# Patient Record
Sex: Female | Born: 1935 | Race: White | Hispanic: No | Smoking: Never smoker
Health system: Southern US, Community
[De-identification: ages and names within clinical notes are randomized; demographics above are authoritative.]

## PROBLEM LIST (undated history)

## (undated) DIAGNOSIS — H9190 Unspecified hearing loss, unspecified ear: Secondary | ICD-10-CM

## (undated) DIAGNOSIS — K648 Other hemorrhoids: Secondary | ICD-10-CM

## (undated) DIAGNOSIS — J984 Other disorders of lung: Secondary | ICD-10-CM

## (undated) DIAGNOSIS — N259 Disorder resulting from impaired renal tubular function, unspecified: Secondary | ICD-10-CM

## (undated) DIAGNOSIS — D696 Thrombocytopenia, unspecified: Secondary | ICD-10-CM

## (undated) DIAGNOSIS — M419 Scoliosis, unspecified: Secondary | ICD-10-CM

## (undated) DIAGNOSIS — E785 Hyperlipidemia, unspecified: Secondary | ICD-10-CM

## (undated) DIAGNOSIS — K5730 Diverticulosis of large intestine without perforation or abscess without bleeding: Secondary | ICD-10-CM

## (undated) DIAGNOSIS — K219 Gastro-esophageal reflux disease without esophagitis: Secondary | ICD-10-CM

## (undated) DIAGNOSIS — R42 Dizziness and giddiness: Secondary | ICD-10-CM

## (undated) DIAGNOSIS — Z853 Personal history of malignant neoplasm of breast: Secondary | ICD-10-CM

## (undated) DIAGNOSIS — I6529 Occlusion and stenosis of unspecified carotid artery: Secondary | ICD-10-CM

## (undated) DIAGNOSIS — K222 Esophageal obstruction: Secondary | ICD-10-CM

## (undated) DIAGNOSIS — I10 Essential (primary) hypertension: Secondary | ICD-10-CM

## (undated) DIAGNOSIS — E559 Vitamin D deficiency, unspecified: Secondary | ICD-10-CM

## (undated) DIAGNOSIS — M810 Age-related osteoporosis without current pathological fracture: Secondary | ICD-10-CM

## (undated) HISTORY — DX: Scoliosis, unspecified: M41.9

## (undated) HISTORY — DX: Other hemorrhoids: K64.8

## (undated) HISTORY — DX: Esophageal obstruction: K22.2

## (undated) HISTORY — DX: Age-related osteoporosis without current pathological fracture: M81.0

## (undated) HISTORY — DX: Diverticulosis of large intestine without perforation or abscess without bleeding: K57.30

## (undated) HISTORY — PX: TONSILLECTOMY: SUR1361

## (undated) HISTORY — DX: Disorder resulting from impaired renal tubular function, unspecified: N25.9

## (undated) HISTORY — PX: CARPAL TUNNEL RELEASE: SHX101

## (undated) HISTORY — DX: Personal history of malignant neoplasm of breast: Z85.3

## (undated) HISTORY — DX: Essential (primary) hypertension: I10

## (undated) HISTORY — DX: Hyperlipidemia, unspecified: E78.5

## (undated) HISTORY — DX: Dizziness and giddiness: R42

## (undated) HISTORY — DX: Thrombocytopenia, unspecified: D69.6

## (undated) HISTORY — DX: Other disorders of lung: J98.4

## (undated) HISTORY — DX: Vitamin D deficiency, unspecified: E55.9

## (undated) HISTORY — DX: Gastro-esophageal reflux disease without esophagitis: K21.9

## (undated) HISTORY — PX: HEMORRHOID BANDING: SHX5850

## (undated) HISTORY — DX: Occlusion and stenosis of unspecified carotid artery: I65.29

## (undated) HISTORY — DX: Unspecified hearing loss, unspecified ear: H91.90

---

## 1987-07-25 HISTORY — PX: TUBAL LIGATION: SHX77

## 1998-05-03 DIAGNOSIS — K219 Gastro-esophageal reflux disease without esophagitis: Secondary | ICD-10-CM

## 1998-05-03 DIAGNOSIS — K222 Esophageal obstruction: Secondary | ICD-10-CM | POA: Insufficient documentation

## 1998-05-03 HISTORY — DX: Gastro-esophageal reflux disease without esophagitis: K21.9

## 1998-05-03 HISTORY — DX: Esophageal obstruction: K22.2

## 1998-06-01 ENCOUNTER — Other Ambulatory Visit: Admission: RE | Admit: 1998-06-01 | Discharge: 1998-06-01 | Payer: Self-pay | Admitting: *Deleted

## 1998-06-30 ENCOUNTER — Encounter: Payer: Self-pay | Admitting: Gastroenterology

## 1998-07-08 ENCOUNTER — Ambulatory Visit (HOSPITAL_BASED_OUTPATIENT_CLINIC_OR_DEPARTMENT_OTHER): Admission: RE | Admit: 1998-07-08 | Discharge: 1998-07-08 | Payer: Self-pay | Admitting: *Deleted

## 2003-07-13 ENCOUNTER — Other Ambulatory Visit: Admission: RE | Admit: 2003-07-13 | Discharge: 2003-07-13 | Payer: Self-pay | Admitting: Endocrinology

## 2003-09-09 DIAGNOSIS — K5730 Diverticulosis of large intestine without perforation or abscess without bleeding: Secondary | ICD-10-CM | POA: Insufficient documentation

## 2003-09-09 DIAGNOSIS — K648 Other hemorrhoids: Secondary | ICD-10-CM

## 2003-09-09 HISTORY — DX: Other hemorrhoids: K64.8

## 2003-09-09 HISTORY — DX: Diverticulosis of large intestine without perforation or abscess without bleeding: K57.30

## 2003-09-22 HISTORY — PX: MASTECTOMY: SHX3

## 2003-09-25 ENCOUNTER — Ambulatory Visit (HOSPITAL_COMMUNITY): Admission: RE | Admit: 2003-09-25 | Discharge: 2003-09-25 | Payer: Self-pay | Admitting: *Deleted

## 2003-09-28 ENCOUNTER — Encounter (INDEPENDENT_AMBULATORY_CARE_PROVIDER_SITE_OTHER): Payer: Self-pay | Admitting: *Deleted

## 2003-09-28 ENCOUNTER — Ambulatory Visit (HOSPITAL_COMMUNITY): Admission: RE | Admit: 2003-09-28 | Discharge: 2003-09-29 | Payer: Self-pay | Admitting: *Deleted

## 2003-10-12 ENCOUNTER — Ambulatory Visit (HOSPITAL_COMMUNITY): Admission: RE | Admit: 2003-10-12 | Discharge: 2003-10-12 | Payer: Self-pay | Admitting: *Deleted

## 2003-11-09 ENCOUNTER — Encounter: Admission: RE | Admit: 2003-11-09 | Discharge: 2003-11-09 | Payer: Self-pay | Admitting: Oncology

## 2003-12-02 ENCOUNTER — Ambulatory Visit (HOSPITAL_COMMUNITY): Admission: RE | Admit: 2003-12-02 | Discharge: 2003-12-02 | Payer: Self-pay | Admitting: Oncology

## 2004-04-28 ENCOUNTER — Other Ambulatory Visit: Admission: RE | Admit: 2004-04-28 | Discharge: 2004-04-28 | Payer: Self-pay | Admitting: Endocrinology

## 2004-05-03 HISTORY — PX: US ECHOCARDIOGRAPHY: HXRAD669

## 2004-06-03 ENCOUNTER — Ambulatory Visit: Payer: Self-pay | Admitting: Oncology

## 2004-11-24 ENCOUNTER — Ambulatory Visit: Payer: Self-pay | Admitting: Oncology

## 2005-02-24 ENCOUNTER — Ambulatory Visit: Payer: Self-pay | Admitting: Oncology

## 2005-03-28 ENCOUNTER — Ambulatory Visit: Payer: Self-pay | Admitting: Endocrinology

## 2005-04-25 ENCOUNTER — Ambulatory Visit: Payer: Self-pay | Admitting: Endocrinology

## 2005-05-02 ENCOUNTER — Ambulatory Visit: Payer: Self-pay | Admitting: Endocrinology

## 2005-05-02 HISTORY — PX: ELECTROCARDIOGRAM: SHX264

## 2005-05-19 ENCOUNTER — Ambulatory Visit: Payer: Self-pay | Admitting: Oncology

## 2005-10-23 ENCOUNTER — Ambulatory Visit: Payer: Self-pay | Admitting: Family Medicine

## 2005-11-23 ENCOUNTER — Ambulatory Visit: Payer: Self-pay | Admitting: Oncology

## 2005-11-27 LAB — LACTATE DEHYDROGENASE: LDH: 208 U/L (ref 94–250)

## 2005-11-27 LAB — COMPREHENSIVE METABOLIC PANEL
AST: 25 U/L (ref 0–37)
BUN: 29 mg/dL — ABNORMAL HIGH (ref 6–23)
CO2: 28 mEq/L (ref 19–32)
Calcium: 9 mg/dL (ref 8.4–10.5)
Chloride: 103 mEq/L (ref 96–112)
Creatinine, Ser: 1.3 mg/dL — ABNORMAL HIGH (ref 0.4–1.2)
Glucose, Bld: 105 mg/dL — ABNORMAL HIGH (ref 70–99)

## 2005-11-27 LAB — CANCER ANTIGEN 27.29: CA 27.29: 19 U/mL (ref 0–39)

## 2005-11-27 LAB — CBC WITH DIFFERENTIAL/PLATELET
Basophils Absolute: 0 10*3/uL (ref 0.0–0.1)
EOS%: 5.9 % (ref 0.0–7.0)
Eosinophils Absolute: 0.5 10*3/uL (ref 0.0–0.5)
HCT: 33.9 % — ABNORMAL LOW (ref 34.8–46.6)
HGB: 11.6 g/dL (ref 11.6–15.9)
MCH: 29.6 pg (ref 26.0–34.0)
NEUT%: 74.3 % (ref 39.6–76.8)
lymph#: 1.2 10*3/uL (ref 0.9–3.3)

## 2005-11-30 ENCOUNTER — Ambulatory Visit: Payer: Self-pay | Admitting: Endocrinology

## 2006-05-03 ENCOUNTER — Ambulatory Visit: Payer: Self-pay | Admitting: Endocrinology

## 2006-05-03 LAB — CONVERTED CEMR LAB
ALT: 22 units/L (ref 0–40)
Basophils Relative: 0.4 % (ref 0.0–1.0)
Bilirubin Urine: NEGATIVE
CO2: 31 meq/L (ref 19–32)
Calcium: 9.6 mg/dL (ref 8.4–10.5)
Chloride: 106 meq/L (ref 96–112)
Chol/HDL Ratio, serum: 3.2
Glomerular Filtration Rate, Af Am: 52 mL/min/{1.73_m2}
Glucose, Bld: 94 mg/dL (ref 70–99)
HDL: 44.6 mg/dL (ref >39.0)
Hemoglobin, Urine: NEGATIVE
Ketones, ur: NEGATIVE mg/dL
LDL Cholesterol: 84 mg/dL (ref 0–99)
Lymphocytes Relative: 18.4 % (ref 12.0–46.0)
MCV: 87.8 fL (ref 78.0–100.0)
Monocytes Absolute: 0.3 10*3/uL (ref 0.2–0.7)
Platelets: 230 10*3/uL (ref 150–400)
TSH: 1.68 microintl units/mL (ref 0.35–5.50)
Triglyceride fasting, serum: 79 mg/dL (ref 0–149)
VLDL: 16 mg/dL (ref 0–40)
WBC: 7.6 10*3/uL (ref 4.5–10.5)
pH: 6 (ref 5.0–8.0)

## 2006-05-30 ENCOUNTER — Ambulatory Visit: Payer: Self-pay | Admitting: Oncology

## 2006-06-01 LAB — CBC WITH DIFFERENTIAL/PLATELET
Basophils Absolute: 0 10*3/uL (ref 0.0–0.1)
EOS%: 6.6 % (ref 0.0–7.0)
HCT: 32 % — ABNORMAL LOW (ref 34.8–46.6)
HGB: 10.9 g/dL — ABNORMAL LOW (ref 11.6–15.9)
LYMPH%: 15.8 % (ref 14.0–48.0)
MCH: 29.9 pg (ref 26.0–34.0)
MCV: 87.6 fL (ref 81.0–101.0)
MONO%: 6 % (ref 0.0–13.0)
NEUT%: 71.3 % (ref 39.6–76.8)

## 2006-06-01 LAB — COMPREHENSIVE METABOLIC PANEL
AST: 22 U/L (ref 0–37)
Alkaline Phosphatase: 52 U/L (ref 39–117)
BUN: 36 mg/dL — ABNORMAL HIGH (ref 6–23)
Calcium: 9.4 mg/dL (ref 8.4–10.5)
Creatinine, Ser: 1.36 mg/dL — ABNORMAL HIGH (ref 0.40–1.20)
Total Bilirubin: 0.3 mg/dL (ref 0.3–1.2)

## 2006-06-22 LAB — CBC & DIFF AND RETIC
Eosinophils Absolute: 0.8 10*3/uL — ABNORMAL HIGH (ref 0.0–0.5)
HCT: 33.7 % — ABNORMAL LOW (ref 34.8–46.6)
LYMPH%: 17.7 % (ref 14.0–48.0)
MONO#: 0.5 10*3/uL (ref 0.1–0.9)
NEUT#: 6 10*3/uL (ref 1.5–6.5)
Platelets: 324 10*3/uL (ref 145–400)
RBC: 3.86 10*6/uL (ref 3.70–5.32)
RETIC #: 51.3 10*3/uL (ref 19.7–115.1)
Retic %: 1.3 % (ref 0.4–2.3)
WBC: 9 10*3/uL (ref 3.9–10.0)

## 2006-06-22 LAB — MORPHOLOGY: PLT EST: ADEQUATE

## 2006-06-26 LAB — FERRITIN: Ferritin: 27 ng/mL (ref 10–291)

## 2006-06-26 LAB — PROTEIN ELECTROPHORESIS, SERUM
Alpha-1-Globulin: 4.4 % (ref 2.9–4.9)
Alpha-2-Globulin: 9.8 % (ref 7.1–11.8)
Beta Globulin: 7.1 % (ref 4.7–7.2)
Total Protein, Serum Electrophoresis: 7.3 g/dL (ref 6.0–8.3)

## 2006-06-26 LAB — FOLATE RBC: RBC Folate: 1022 ng/mL — ABNORMAL HIGH (ref 180–600)

## 2006-06-26 LAB — IRON AND TIBC
%SAT: 24 % (ref 20–55)
Iron: 82 ug/dL (ref 42–145)

## 2006-09-21 ENCOUNTER — Ambulatory Visit: Payer: Self-pay | Admitting: Endocrinology

## 2006-11-28 ENCOUNTER — Ambulatory Visit: Payer: Self-pay | Admitting: Oncology

## 2006-11-30 LAB — COMPREHENSIVE METABOLIC PANEL
ALT: 16 U/L (ref 0–35)
AST: 24 U/L (ref 0–37)
Albumin: 3.9 g/dL (ref 3.5–5.2)
CO2: 27 mEq/L (ref 19–32)
Calcium: 9.5 mg/dL (ref 8.4–10.5)
Chloride: 102 mEq/L (ref 96–112)
Potassium: 4.4 mEq/L (ref 3.5–5.3)
Total Protein: 6.8 g/dL (ref 6.0–8.3)

## 2006-11-30 LAB — CBC WITH DIFFERENTIAL/PLATELET
BASO%: 0.5 % (ref 0.0–2.0)
EOS%: 6 % (ref 0.0–7.0)
HCT: 32.3 % — ABNORMAL LOW (ref 34.8–46.6)
HGB: 11.4 g/dL — ABNORMAL LOW (ref 11.6–15.9)
MCH: 29.8 pg (ref 26.0–34.0)
MCHC: 35.3 g/dL (ref 32.0–36.0)
MONO#: 0.6 10*3/uL (ref 0.1–0.9)
NEUT%: 70.8 % (ref 39.6–76.8)
RDW: 13.6 % (ref 11.3–14.5)
WBC: 9.4 10*3/uL (ref 3.9–10.0)
lymph#: 1.6 10*3/uL (ref 0.9–3.3)

## 2007-02-20 ENCOUNTER — Ambulatory Visit: Payer: Self-pay | Admitting: Internal Medicine

## 2007-02-20 LAB — CONVERTED CEMR LAB
Basophils Relative: 0 % (ref 0.0–1.0)
Eosinophils Relative: 6.7 % — ABNORMAL HIGH (ref 0.0–5.0)
HCT: 35.4 % — ABNORMAL LOW (ref 36.0–46.0)
Hemoglobin: 12.2 g/dL (ref 12.0–15.0)
Lymphocytes Relative: 20.8 % (ref 12.0–46.0)
Monocytes Absolute: 0.8 10*3/uL — ABNORMAL HIGH (ref 0.2–0.7)
Monocytes Relative: 6.6 % (ref 3.0–11.0)
Neutro Abs: 7.6 10*3/uL (ref 1.4–7.7)
Neutrophils Relative %: 65.9 % (ref 43.0–77.0)
Prothrombin Time: 11.8 s (ref 10.0–14.0)
WBC: 11.6 10*3/uL — ABNORMAL HIGH (ref 4.5–10.5)

## 2007-02-27 ENCOUNTER — Ambulatory Visit: Payer: Self-pay | Admitting: Internal Medicine

## 2007-02-27 LAB — CONVERTED CEMR LAB
Fecal Occult Blood: NEGATIVE
OCCULT 5: NEGATIVE

## 2007-02-28 DIAGNOSIS — E785 Hyperlipidemia, unspecified: Secondary | ICD-10-CM | POA: Insufficient documentation

## 2007-02-28 DIAGNOSIS — R42 Dizziness and giddiness: Secondary | ICD-10-CM

## 2007-02-28 DIAGNOSIS — Z853 Personal history of malignant neoplasm of breast: Secondary | ICD-10-CM | POA: Insufficient documentation

## 2007-02-28 DIAGNOSIS — I10 Essential (primary) hypertension: Secondary | ICD-10-CM

## 2007-02-28 HISTORY — DX: Personal history of malignant neoplasm of breast: Z85.3

## 2007-02-28 HISTORY — DX: Essential (primary) hypertension: I10

## 2007-02-28 HISTORY — DX: Hyperlipidemia, unspecified: E78.5

## 2007-02-28 HISTORY — DX: Dizziness and giddiness: R42

## 2007-03-19 ENCOUNTER — Ambulatory Visit: Payer: Self-pay | Admitting: Gastroenterology

## 2007-03-21 ENCOUNTER — Ambulatory Visit: Payer: Self-pay | Admitting: Gastroenterology

## 2007-04-05 ENCOUNTER — Ambulatory Visit: Payer: Self-pay | Admitting: Oncology

## 2007-05-30 ENCOUNTER — Ambulatory Visit: Payer: Self-pay | Admitting: Oncology

## 2007-06-03 LAB — FERRITIN: Ferritin: 44 ng/mL (ref 10–291)

## 2007-06-03 LAB — CBC & DIFF AND RETIC
BASO%: 0.4 % (ref 0.0–2.0)
EOS%: 6 % (ref 0.0–7.0)
IRF: 0.23 (ref 0.130–0.330)
MCH: 30.2 pg (ref 26.0–34.0)
MCHC: 35 g/dL (ref 32.0–36.0)
MONO#: 0.5 10*3/uL (ref 0.1–0.9)
NEUT%: 73.1 % (ref 39.6–76.8)
RBC: 4.25 10*6/uL (ref 3.70–5.32)
RDW: 13 % (ref 11.3–14.5)
RETIC #: 41.7 10*3/uL (ref 19.7–115.1)
Retic %: 1 % (ref 0.4–2.3)
WBC: 9.8 10*3/uL (ref 3.9–10.0)
lymph#: 1.5 10*3/uL (ref 0.9–3.3)

## 2007-06-03 LAB — COMPREHENSIVE METABOLIC PANEL
ALT: 16 U/L (ref 0–35)
CO2: 25 mEq/L (ref 19–32)
Calcium: 9.7 mg/dL (ref 8.4–10.5)
Chloride: 101 mEq/L (ref 96–112)
Creatinine, Ser: 1.26 mg/dL — ABNORMAL HIGH (ref 0.40–1.20)
Glucose, Bld: 84 mg/dL (ref 70–99)

## 2007-06-03 LAB — IRON AND TIBC
%SAT: 25 % (ref 20–55)
Iron: 73 ug/dL (ref 42–145)

## 2007-06-03 LAB — CHCC SMEAR

## 2007-06-10 ENCOUNTER — Encounter: Payer: Self-pay | Admitting: Endocrinology

## 2007-07-04 ENCOUNTER — Encounter: Payer: Self-pay | Admitting: Endocrinology

## 2007-08-01 ENCOUNTER — Telehealth: Payer: Self-pay | Admitting: Endocrinology

## 2007-08-06 ENCOUNTER — Telehealth: Payer: Self-pay | Admitting: Endocrinology

## 2007-09-04 ENCOUNTER — Ambulatory Visit: Payer: Self-pay | Admitting: Endocrinology

## 2007-09-04 LAB — CONVERTED CEMR LAB
Alkaline Phosphatase: 55 units/L (ref 39–117)
BUN: 21 mg/dL (ref 6–23)
Basophils Relative: 0.8 % (ref 0.0–1.0)
Bilirubin Urine: NEGATIVE
Bilirubin, Direct: 0.2 mg/dL (ref 0.0–0.3)
CO2: 32 meq/L (ref 19–32)
Creatinine, Ser: 1.1 mg/dL (ref 0.4–1.2)
GFR calc Af Amer: 62 mL/min
HCT: 38.5 % (ref 36.0–46.0)
HDL: 54.9 mg/dL (ref >39.0)
Hemoglobin, Urine: NEGATIVE
Hemoglobin: 12.4 g/dL (ref 12.0–15.0)
Leukocytes, UA: NEGATIVE
Lymphocytes Relative: 17.5 % (ref 12.0–46.0)
MCHC: 32.3 g/dL (ref 30.0–36.0)
Monocytes Absolute: 0.4 10*3/uL (ref 0.2–0.7)
Monocytes Relative: 5.8 % (ref 3.0–11.0)
Neutro Abs: 4.6 10*3/uL (ref 1.4–7.7)
Neutrophils Relative %: 68 % (ref 43.0–77.0)
Potassium: 4.3 meq/L (ref 3.5–5.1)
Sodium: 140 meq/L (ref 135–145)
Specific Gravity, Urine: 1.015 (ref 1.000–1.03)
TSH: 1.94 microintl units/mL (ref 0.35–5.50)
Total Bilirubin: 0.8 mg/dL (ref 0.3–1.2)
Total Protein: 6.9 g/dL (ref 6.0–8.3)
Triglycerides: 86 mg/dL (ref 0–149)
Urobilinogen, UA: 0.2 (ref 0.0–1.0)

## 2007-09-10 ENCOUNTER — Ambulatory Visit: Payer: Self-pay | Admitting: Endocrinology

## 2007-09-16 ENCOUNTER — Encounter: Payer: Self-pay | Admitting: Endocrinology

## 2007-10-17 ENCOUNTER — Encounter: Payer: Self-pay | Admitting: Endocrinology

## 2007-11-28 ENCOUNTER — Ambulatory Visit: Payer: Self-pay | Admitting: Internal Medicine

## 2007-11-28 ENCOUNTER — Encounter: Payer: Self-pay | Admitting: Endocrinology

## 2007-12-03 ENCOUNTER — Ambulatory Visit: Payer: Self-pay | Admitting: Oncology

## 2007-12-12 ENCOUNTER — Encounter: Payer: Self-pay | Admitting: Endocrinology

## 2008-02-03 ENCOUNTER — Encounter: Payer: Self-pay | Admitting: Endocrinology

## 2008-03-27 ENCOUNTER — Encounter: Payer: Self-pay | Admitting: Endocrinology

## 2008-06-12 ENCOUNTER — Ambulatory Visit: Payer: Self-pay | Admitting: Oncology

## 2008-06-16 ENCOUNTER — Encounter (INDEPENDENT_AMBULATORY_CARE_PROVIDER_SITE_OTHER): Payer: Self-pay | Admitting: *Deleted

## 2008-06-16 LAB — CONVERTED CEMR LAB
ALT: 16 U/L
AST: 23 U/L
Albumin: 3.8 g/dL
Alkaline Phosphatase: 67 U/L
BUN: 29 mg/dL
CO2: 26 meq/L
Calcium: 9.3 mg/dL
Chloride: 102 meq/L
Creatinine, Ser: 1.22 mg/dL
Glucose, Bld: 102 mg/dL
Potassium: 4.5 meq/L
Sodium: 137 meq/L
Total Bilirubin: 0.4 mg/dL
Total Protein: 6.6 g/dL

## 2008-06-16 LAB — CBC WITH DIFFERENTIAL/PLATELET
Basophils Absolute: 0 10*3/uL (ref 0.0–0.1)
Eosinophils Absolute: 0.7 10*3/uL — ABNORMAL HIGH (ref 0.0–0.5)
HCT: 34.5 % — ABNORMAL LOW (ref 34.8–46.6)
HGB: 11.9 g/dL (ref 11.6–15.9)
LYMPH%: 18 % (ref 14.0–48.0)
MCV: 87 fL (ref 81.0–101.0)
MONO#: 0.6 10*3/uL (ref 0.1–0.9)
NEUT#: 6 10*3/uL (ref 1.5–6.5)
NEUT%: 67.3 % (ref 39.6–76.8)
Platelets: 312 10*3/uL (ref 145–400)
RBC: 3.97 10*6/uL (ref 3.70–5.32)
WBC: 9 10*3/uL (ref 3.9–10.0)

## 2008-06-17 LAB — VITAMIN D 25 HYDROXY (VIT D DEFICIENCY, FRACTURES): Vit D, 25-Hydroxy: 37 ng/mL (ref 30–89)

## 2008-06-17 LAB — COMPREHENSIVE METABOLIC PANEL
BUN: 29 mg/dL — ABNORMAL HIGH (ref 6–23)
CO2: 26 mEq/L (ref 19–32)
Glucose, Bld: 102 mg/dL — ABNORMAL HIGH (ref 70–99)
Sodium: 137 mEq/L (ref 135–145)
Total Bilirubin: 0.4 mg/dL (ref 0.3–1.2)
Total Protein: 6.6 g/dL (ref 6.0–8.3)

## 2008-06-17 LAB — LACTATE DEHYDROGENASE: LDH: 186 U/L (ref 94–250)

## 2008-06-17 LAB — CANCER ANTIGEN 27.29: CA 27.29: 16 U/mL (ref 0–39)

## 2008-06-22 ENCOUNTER — Encounter: Payer: Self-pay | Admitting: Endocrinology

## 2008-07-14 ENCOUNTER — Encounter: Payer: Self-pay | Admitting: Endocrinology

## 2008-09-16 ENCOUNTER — Encounter: Payer: Self-pay | Admitting: Internal Medicine

## 2008-11-23 ENCOUNTER — Encounter: Payer: Self-pay | Admitting: Endocrinology

## 2008-11-23 ENCOUNTER — Encounter: Payer: Self-pay | Admitting: Internal Medicine

## 2008-12-14 ENCOUNTER — Telehealth (INDEPENDENT_AMBULATORY_CARE_PROVIDER_SITE_OTHER): Payer: Self-pay | Admitting: *Deleted

## 2008-12-14 ENCOUNTER — Ambulatory Visit: Payer: Self-pay | Admitting: Oncology

## 2008-12-16 ENCOUNTER — Encounter (INDEPENDENT_AMBULATORY_CARE_PROVIDER_SITE_OTHER): Payer: Self-pay | Admitting: *Deleted

## 2008-12-16 LAB — CONVERTED CEMR LAB
ALT: 22 units/L
AST: 28 units/L
Alkaline Phosphatase: 59 units/L
CO2: 27 meq/L
Calcium: 9.2 mg/dL
Glucose, Bld: 130 mg/dL
Sodium: 142 meq/L

## 2008-12-16 LAB — CBC WITH DIFFERENTIAL/PLATELET
Eosinophils Absolute: 0.5 10*3/uL (ref 0.0–0.5)
HCT: 34.1 % — ABNORMAL LOW (ref 34.8–46.6)
LYMPH%: 18.7 % (ref 14.0–49.7)
MONO#: 0.4 10*3/uL (ref 0.1–0.9)
NEUT#: 5.4 10*3/uL (ref 1.5–6.5)
NEUT%: 69.9 % (ref 38.4–76.8)
Platelets: 280 10*3/uL (ref 145–400)
RBC: 3.89 10*6/uL (ref 3.70–5.45)
WBC: 7.7 10*3/uL (ref 3.9–10.3)

## 2008-12-17 LAB — COMPREHENSIVE METABOLIC PANEL
CO2: 27 mEq/L (ref 19–32)
Calcium: 9.2 mg/dL (ref 8.4–10.5)
Chloride: 105 mEq/L (ref 96–112)
Glucose, Bld: 130 mg/dL — ABNORMAL HIGH (ref 70–99)
Sodium: 142 mEq/L (ref 135–145)
Total Bilirubin: 0.4 mg/dL (ref 0.3–1.2)
Total Protein: 6.4 g/dL (ref 6.0–8.3)

## 2008-12-17 LAB — CANCER ANTIGEN 27.29: CA 27.29: 20 U/mL (ref 0–39)

## 2008-12-17 LAB — LACTATE DEHYDROGENASE: LDH: 198 U/L (ref 94–250)

## 2008-12-25 ENCOUNTER — Ambulatory Visit: Payer: Self-pay | Admitting: Endocrinology

## 2008-12-25 DIAGNOSIS — R05 Cough: Secondary | ICD-10-CM | POA: Insufficient documentation

## 2008-12-25 DIAGNOSIS — R059 Cough, unspecified: Secondary | ICD-10-CM | POA: Insufficient documentation

## 2008-12-25 DIAGNOSIS — N259 Disorder resulting from impaired renal tubular function, unspecified: Secondary | ICD-10-CM | POA: Insufficient documentation

## 2008-12-25 DIAGNOSIS — D696 Thrombocytopenia, unspecified: Secondary | ICD-10-CM | POA: Insufficient documentation

## 2008-12-25 HISTORY — DX: Disorder resulting from impaired renal tubular function, unspecified: N25.9

## 2008-12-25 HISTORY — DX: Thrombocytopenia, unspecified: D69.6

## 2008-12-30 ENCOUNTER — Encounter: Payer: Self-pay | Admitting: Endocrinology

## 2009-04-15 ENCOUNTER — Ambulatory Visit: Payer: Self-pay | Admitting: Endocrinology

## 2009-04-15 DIAGNOSIS — M25559 Pain in unspecified hip: Secondary | ICD-10-CM | POA: Insufficient documentation

## 2009-04-19 ENCOUNTER — Telehealth: Payer: Self-pay | Admitting: Endocrinology

## 2009-04-19 ENCOUNTER — Ambulatory Visit: Payer: Self-pay | Admitting: Endocrinology

## 2009-04-19 LAB — CONVERTED CEMR LAB
BUN: 25 mg/dL — ABNORMAL HIGH (ref 6–23)
Creatinine, Ser: 1.2 mg/dL (ref 0.4–1.2)

## 2009-04-28 ENCOUNTER — Telehealth: Payer: Self-pay | Admitting: Endocrinology

## 2009-05-11 ENCOUNTER — Encounter: Payer: Self-pay | Admitting: Endocrinology

## 2009-05-20 ENCOUNTER — Encounter: Payer: Self-pay | Admitting: Endocrinology

## 2009-09-30 ENCOUNTER — Encounter: Payer: Self-pay | Admitting: Endocrinology

## 2009-10-04 ENCOUNTER — Telehealth (INDEPENDENT_AMBULATORY_CARE_PROVIDER_SITE_OTHER): Payer: Self-pay | Admitting: *Deleted

## 2009-10-29 ENCOUNTER — Encounter (INDEPENDENT_AMBULATORY_CARE_PROVIDER_SITE_OTHER): Payer: Self-pay | Admitting: *Deleted

## 2009-10-29 ENCOUNTER — Encounter: Payer: Self-pay | Admitting: Endocrinology

## 2009-10-29 LAB — CONVERTED CEMR LAB
CO2: 31 meq/L
Calcium: 9.5 mg/dL
Chloride: 101 meq/L
Creatinine, Ser: 1.07 mg/dL

## 2009-11-15 ENCOUNTER — Encounter: Payer: Self-pay | Admitting: Endocrinology

## 2009-11-29 ENCOUNTER — Encounter: Payer: Self-pay | Admitting: Endocrinology

## 2009-11-29 ENCOUNTER — Ambulatory Visit: Payer: Self-pay | Admitting: Family Medicine

## 2009-11-29 ENCOUNTER — Encounter (INDEPENDENT_AMBULATORY_CARE_PROVIDER_SITE_OTHER): Payer: Self-pay | Admitting: *Deleted

## 2009-12-22 ENCOUNTER — Ambulatory Visit: Payer: Self-pay | Admitting: Oncology

## 2009-12-23 ENCOUNTER — Encounter (INDEPENDENT_AMBULATORY_CARE_PROVIDER_SITE_OTHER): Payer: Self-pay | Admitting: *Deleted

## 2009-12-23 ENCOUNTER — Encounter: Payer: Self-pay | Admitting: Endocrinology

## 2009-12-23 LAB — CONVERTED CEMR LAB
ALT: 24 units/L
AST: 30 units/L
Alkaline Phosphatase: 53 units/L
BUN: 31 mg/dL
Chloride: 103 meq/L
Creatinine, Ser: 1.18 mg/dL
Glucose, Bld: 93 mg/dL
Total Bilirubin: 6.7 mg/dL
Total Protein: 6.7 g/dL

## 2009-12-23 LAB — CBC WITH DIFFERENTIAL/PLATELET
BASO%: 0.4 % (ref 0.0–2.0)
Eosinophils Absolute: 0.4 10*3/uL (ref 0.0–0.5)
MCHC: 34.3 g/dL (ref 31.5–36.0)
MCV: 88.7 fL (ref 79.5–101.0)
MONO%: 5.9 % (ref 0.0–14.0)
NEUT#: 5.8 10*3/uL (ref 1.5–6.5)
RBC: 3.98 10*6/uL (ref 3.70–5.45)
RDW: 13.2 % (ref 11.2–14.5)
WBC: 8.4 10*3/uL (ref 3.9–10.3)

## 2009-12-23 LAB — COMPREHENSIVE METABOLIC PANEL
ALT: 24 U/L (ref 0–35)
AST: 30 U/L (ref 0–37)
Albumin: 4 g/dL (ref 3.5–5.2)
Alkaline Phosphatase: 53 U/L (ref 39–117)
Glucose, Bld: 93 mg/dL (ref 70–99)
Potassium: 4.4 mEq/L (ref 3.5–5.3)
Sodium: 138 mEq/L (ref 135–145)
Total Protein: 6.7 g/dL (ref 6.0–8.3)

## 2009-12-23 LAB — CANCER ANTIGEN 27.29: CA 27.29: 25 U/mL (ref 0–39)

## 2009-12-24 ENCOUNTER — Telehealth (INDEPENDENT_AMBULATORY_CARE_PROVIDER_SITE_OTHER): Payer: Self-pay | Admitting: *Deleted

## 2009-12-24 ENCOUNTER — Encounter (INDEPENDENT_AMBULATORY_CARE_PROVIDER_SITE_OTHER): Payer: Self-pay | Admitting: *Deleted

## 2009-12-27 ENCOUNTER — Encounter: Payer: Self-pay | Admitting: Endocrinology

## 2010-01-10 ENCOUNTER — Ambulatory Visit: Payer: Self-pay | Admitting: Internal Medicine

## 2010-04-14 ENCOUNTER — Encounter: Payer: Self-pay | Admitting: Endocrinology

## 2010-05-03 ENCOUNTER — Encounter: Payer: Self-pay | Admitting: Endocrinology

## 2010-05-12 ENCOUNTER — Encounter: Payer: Self-pay | Admitting: Endocrinology

## 2010-06-13 ENCOUNTER — Encounter: Payer: Self-pay | Admitting: Endocrinology

## 2010-06-13 ENCOUNTER — Ambulatory Visit: Payer: Self-pay | Admitting: Endocrinology

## 2010-06-13 DIAGNOSIS — I6529 Occlusion and stenosis of unspecified carotid artery: Secondary | ICD-10-CM

## 2010-06-13 DIAGNOSIS — M810 Age-related osteoporosis without current pathological fracture: Secondary | ICD-10-CM

## 2010-06-13 HISTORY — DX: Occlusion and stenosis of unspecified carotid artery: I65.29

## 2010-06-13 HISTORY — DX: Age-related osteoporosis without current pathological fracture: M81.0

## 2010-06-13 LAB — CONVERTED CEMR LAB
ALT: 23 units/L (ref 0–35)
Albumin: 3.6 g/dL (ref 3.5–5.2)
Alkaline Phosphatase: 51 units/L (ref 39–117)
Bilirubin, Direct: 0.1 mg/dL (ref 0.0–0.3)
Cholesterol: 139 mg/dL (ref 0–200)
TSH: 1.7 microintl units/mL (ref 0.35–5.50)
Total Protein: 6.3 g/dL (ref 6.0–8.3)
Triglycerides: 60 mg/dL (ref 0.0–149.0)

## 2010-08-14 ENCOUNTER — Encounter: Payer: Self-pay | Admitting: Endocrinology

## 2010-08-14 ENCOUNTER — Encounter: Payer: Self-pay | Admitting: Oncology

## 2010-08-23 NOTE — Assessment & Plan Note (Signed)
Summary: SORE THROAT/NWS   Vital Signs:  Patient profile:   77 year old female Height:      65 inches Weight:      151 pounds BMI:     25.22 O2 Sat:      94 % on Room air Temp:     97.4 degrees F oral Pulse rate:   54 / minute BP sitting:   102 / 62  (left arm) Cuff size:   regular  Vitals Entered ByZella Ball Ewing (January 10, 2010 10:50 AM)  O2 Flow:  Room air CC: Sore throat, cough for 4 days/RE   CC:  Sore throat and cough for 4 days/RE.  History of Present Illness: here with 3 days onset mild to mod severe ST, with fever and mild prod cough, but Pt denies CP, sob, doe, wheezing, orthopnea, pnd, worsening LE edema, palps, dizziness or syncope  Pt denies new neuro symptoms such as headache, facial or extremity weakness   Problems Prior to Update: 1)  Pharyngitis-acute  (ICD-462) 2)  Hip Pain, Right  (ICD-719.45) 3)  Cough  (ICD-786.2) 4)  Thrombocytopenia  (ICD-287.5) 5)  Renal Insufficiency  (ICD-588.9) 6)  Esophageal Stricture  (ICD-530.3) 7)  Gerd  (ICD-530.81) 8)  Internal Hemorrhoids  (ICD-455.0) 9)  Diverticulosis of Colon  (ICD-562.10) 10)  Routine General Medical Exam@health  Care [facility]  (ICD-V70.0) 11)  Hypertension  (ICD-401.9) 12)  Hyperlipidemia  (ICD-272.4) 13)  Dizziness or Vertigo  (ICD-780.4) 14)  Breast Cancer, Hx of  (ICD-V10.3)  Medications Prior to Update: 1)  Tandem Plus 162-115.2-1 Mg  Caps (Fefum-Fepo-Fa-B Cmp-C-Zn-Mn-Cu) .... Take 1 By Mouth Qd 2)  Centrum Silver   Tabs (Multiple Vitamins-Minerals) .... Take 1 By Mouth Qd 3)  Simvastatin 80 Mg  Tabs (Simvastatin) .... Take 1 By Mouth Once Daily Keep 09/10/07 Appt For Addtional Refills 4)  Fosamax 70 Mg Tabs (Alendronate Sodium) 5)  Benicar 5 Mg  Tabs (Olmesartan Medoxomil) .... Qd 6)  Adult Aspirin Low Strength 81 Mg Tbdp (Aspirin) 7)  Darvocet-N 100 100-650 Mg Tabs (Propoxyphene N-Apap) .Marland Kitchen.. 1 Q4h As Needed Pain 8)  Promethazine Hcl 12.5 Mg Tabs (Promethazine Hcl) .Marland Kitchen.. 1 Three Times A Day As  Needed Pain  Current Medications (verified): 1)  Tandem Plus 162-115.2-1 Mg  Caps (Fefum-Fepo-Fa-B Cmp-C-Zn-Mn-Cu) .... Take 1 By Mouth Qd 2)  Centrum Silver   Tabs (Multiple Vitamins-Minerals) .... Take 1 By Mouth Qd 3)  Simvastatin 80 Mg  Tabs (Simvastatin) .... Take 1 By Mouth Once Daily Keep 09/10/07 Appt For Addtional Refills 4)  Fosamax 70 Mg Tabs (Alendronate Sodium) 5)  Benicar 5 Mg  Tabs (Olmesartan Medoxomil) .... Qd 6)  Adult Aspirin Low Strength 81 Mg Tbdp (Aspirin) 7)  Darvocet-N 100 100-650 Mg Tabs (Propoxyphene N-Apap) .Marland Kitchen.. 1 Q4h As Needed Pain 8)  Promethazine Hcl 12.5 Mg Tabs (Promethazine Hcl) .Marland Kitchen.. 1 Three Times A Day As Needed Pain 9)  Vitamin D (Ergocalciferol) 50000 Unit Caps (Ergocalciferol) .Marland Kitchen.. 1 By Mouth Monthly 10)  Cephalexin 500 Mg Caps (Cephalexin) .Marland Kitchen.. 1po Three Times A Day  Allergies (verified): 1)  ! Zestril  Past History:  Past Medical History: Last updated: 10/24/2007 Breast cancer, hx of Dizziness or vertigo Hyperlipidemia Hypertension Scoliosis Chronic scarring of the lung apices Current Problems:  ESOPHAGEAL STRICTURE (ICD-530.3) GERD (ICD-530.81) INTERNAL HEMORRHOIDS (ICD-455.0) DIVERTICULOSIS OF COLON (ICD-562.10) ROUTINE GENERAL MEDICAL EXAM@HEALTH  CARE FACL (ICD-V70.0) HYPERTENSION (ICD-401.9) HYPERLIPIDEMIA (ICD-272.4) DIZZINESS OR VERTIGO (ICD-780.4) BREAST CANCER, HX OF (ICD-V10.3)  Past Surgical History: Last updated: 02/28/2007 Left  Mastectomy (09/2003) Tubal ligation (1989) DEXA (10/2005) Echocardiogram (05/03/2004) EKG (05/02/2005)  Social History: Last updated: 09/10/2007 married retired  Risk Factors: Smoking Status: never (02/28/2007)  Review of Systems       all otherwise negative per pt -    Physical Exam  General:  alert and well-developed.  , mild ill  Head:  normocephalic and atraumatic.   Eyes:  vision grossly intact, pupils equal, and pupils round.   Ears:  bilat tms' red, sinus nontender Nose:   nasal dischargemucosal pallor and mucosal edema.   Mouth:  pharyngeal erythema and fair dentition.   Neck:  supple and cervical lymphadenopathy.   Lungs:  normal respiratory effort and normal breath sounds.   Heart:  normal rate and regular rhythm.   Extremities:  no edema, no erythema    Impression & Recommendations:  Problem # 1:  PHARYNGITIS-ACUTE (ICD-462)  Her updated medication list for this problem includes:    Adult Aspirin Low Strength 81 Mg Tbdp (Aspirin)    Cephalexin 500 Mg Caps (Cephalexin) .Marland Kitchen... 1po three times a day with mild prod cough - treat as above, f/u any worsening signs or symptoms , and mucinex dm as needed   Problem # 2:  HYPERTENSION (ICD-401.9)  Her updated medication list for this problem includes:    Benicar 5 Mg Tabs (Olmesartan medoxomil) ..... Qd  BP today: 102/62 Prior BP: 128/70 (04/15/2009)  Labs Reviewed: K+: 4.4 (12/23/2009) Creat: : 1.18 (12/23/2009)   Chol: 138 (09/04/2007)   HDL: 54.9 (09/04/2007)   LDL: 66 (09/04/2007)   TG: 86 (09/04/2007) stable overall by hx and exam, ok to continue meds/tx as is   Complete Medication List: 1)  Tandem Plus 162-115.2-1 Mg Caps (Fefum-fepo-fa-b cmp-c-zn-mn-cu) .... Take 1 by mouth qd 2)  Centrum Silver Tabs (Multiple vitamins-minerals) .... Take 1 by mouth qd 3)  Simvastatin 80 Mg Tabs (Simvastatin) .... Take 1 by mouth once daily keep 09/10/07 appt for addtional refills 4)  Fosamax 70 Mg Tabs (Alendronate sodium) 5)  Benicar 5 Mg Tabs (Olmesartan medoxomil) .... Qd 6)  Adult Aspirin Low Strength 81 Mg Tbdp (Aspirin) 7)  Darvocet-n 100 100-650 Mg Tabs (Propoxyphene n-apap) .Marland Kitchen.. 1 q4h as needed pain 8)  Promethazine Hcl 12.5 Mg Tabs (Promethazine hcl) .Marland Kitchen.. 1 three times a day as needed pain 9)  Vitamin D (ergocalciferol) 50000 Unit Caps (Ergocalciferol) .Marland Kitchen.. 1 by mouth monthly 10)  Cephalexin 500 Mg Caps (Cephalexin) .Marland Kitchen.. 1po three times a day   Patient Instructions: 1)  Please take all new  medications as prescribed 2)  Continue all previous medications as before this visit  3)  You can also use Mucinex DM OTC or it's generic for congestion  4)  Please schedule an appointment with your primary doctor as needed Prescriptions: CEPHALEXIN 500 MG CAPS (CEPHALEXIN) 1po three times a day  #30 x 0   Entered and Authorized by:   Corwin Levins MD   Signed by:   Corwin Levins MD on 01/10/2010   Method used:   Print then Give to Patient   RxID:   [id number]

## 2010-08-23 NOTE — Progress Notes (Signed)
Summary: Lab results    -  Date:  12/23/2009    BG Random: 93    BUN: 31    Creatinine: 1.18    Sodium: 138    Potassium: 4.4    Chloride: 103    CO2 Total: 23    Calcium: 9.5    Alk Phos: 53    SGOT (AST): 30    SGPT (ALT): 24    T. Bilirubin: 6.7    Total Protein: 6.7  Date:  12/16/2008    Alk Phos: 59    SGOT (AST): 28    SGPT (ALT): 22    T. Bilirubin: 0.4    Total Protein: 6.4    Albumin: 3.9  Date:  06/16/2008    Alk Phos: 67    SGOT (AST): 23    SGPT (ALT): 16    T. Bilirubin: 0.4    Total Protein: 6.6    Albumin: 3.8   Appended Document: Lab results     Clinical Lists Changes  Observations: Added new observation of BASOPHIL %: 0.0 % (12/23/2009 9:36) Added new observation of % EOS AUTO: 0.4 % (12/23/2009 9:36) Added new observation of MONOCYTE %: 0.5 % (12/23/2009 9:36) Added new observation of LYMPH %: 1.6 % (12/23/2009 9:36) Added new observation of PMN %: 5.8 % (12/23/2009 9:36) Added new observation of RDW: 13.2 % (12/23/2009 9:36) Added new observation of MCV: 88.7 fL (12/23/2009 9:36) Added new observation of PLATELETK/UL: 317 K/uL (12/23/2009 9:36) Added new observation of RBC M/UL: 3.98 M/uL (12/23/2009 9:36) Added new observation of HCT: 35.3 % (12/23/2009 9:36) Added new observation of HGB: 12.1 g/dL (56/21/3086 5:78) Added new observation of WBC COUNT: 8.4 10*3/microliter (12/23/2009 9:36) Added new observation of BASOPHIL %: 0.0 % (12/16/2008 9:37) Added new observation of % EOS AUTO: 0.5 % (12/16/2008 9:37) Added new observation of MONOCYTE %: 0.4 % (12/16/2008 9:37) Added new observation of LYMPH %: 1.4 % (12/16/2008 9:37) Added new observation of PMN %: 5.4 % (12/16/2008 9:37) Added new observation of RDW: 13.7 % (12/16/2008 9:37) Added new observation of MCV: 87.7 fL (12/16/2008 9:37) Added new observation of PLATELETK/UL: 280 K/uL (12/16/2008 9:37) Added new observation of RBC M/UL: 3.89 M/uL (12/16/2008 9:37) Added new  observation of HCT: 34.1 % (12/16/2008 9:37) Added new observation of HGB: 12.1 g/dL (46/96/2952 8:41) Added new observation of WBC COUNT: 7.7 10*3/microliter (12/16/2008 9:37) Added new observation of BASOPHIL %: 0.0 % (06/16/2008 9:38) Added new observation of % EOS AUTO: 0.7 % (06/16/2008 9:38) Added new observation of MONOCYTE %: 0.6 % (06/16/2008 9:38) Added new observation of LYMPH %: 1.6 % (06/16/2008 9:38) Added new observation of PMN %: 6.0 % (06/16/2008 9:38) Added new observation of HGB: 11.9 g/dL (32/44/0102 7:25)       -  Date:  12/23/2009    WBC: 8.4    HGB: 12.1    HCT: 35.3    RBC: 3.98    PLT: 317    MCV: 88.7    RDW: 13.2    Neutrophil: 5.8    Lymphs: 1.6    Monos: 0.5    Eos: 0.4    Basophil: 0.0  Date:  12/16/2008    WBC: 7.7    HGB: 12.1    HCT: 34.1    RBC: 3.89    PLT: 280    MCV: 87.7    RDW: 13.7    Neutrophil: 5.4    Lymphs: 1.4    Monos: 0.4    Eos: 0.5  Basophil: 0.0  Date:  06/16/2008    HGB: 11.9    Neutrophil: 6.0    Lymphs: 1.6    Monos: 0.6    Eos: 0.7    Basophil: 0.0

## 2010-08-23 NOTE — Letter (Signed)
Summary: [facility]  [facility]   Imported By: Sherian Rein 12/02/2009 10:10:34  _____________________________________________________________________  External Attachment:    Type:   Image     Comment:   External Document

## 2010-08-23 NOTE — Miscellaneous (Signed)
Summary: BONE DENSITY  Clinical Lists Changes  Orders: Added new Test order of T-Bone Densitometry (77080) - Signed Added new Test order of T-Lumbar Vertebral Assessment (77082) - Signed 

## 2010-08-23 NOTE — Assessment & Plan Note (Signed)
Summary: yearly exam/medicare/#/cd   Vital Signs:  Patient profile:   77 year old female Height:      65 inches (165.10 cm) Weight:      150 pounds (68.18 kg) BMI:     25.05 O2 Sat:      97 % on Room air Temp:     97.1 degrees F (36.17 degrees C) oral Pulse rate:   62 / minute BP sitting:   108 / 62  (right arm) Cuff size:   regular  Vitals Entered By: Brenton Grills CMA Duncan Dull) (June 13, 2010 8:42 AM)  O2 Flow:  Room air CC: Yearly Medicare exam/pt is no longer taking Darvocet, Promethazine, or Cephalexin/aj Is Patient Diabetic? No   CC:  Yearly Medicare exam/pt is no longer taking Darvocet, Promethazine, and or Cephalexin/aj.  History of Present Illness: here for regular wellness examination.  He's feeling pretty well in general, and does not drink or smoke.   Current Medications (verified): 1)  Tandem Plus 162-115.2-1 Mg  Caps (Fefum-Fepo-Fa-B Cmp-C-Zn-Mn-Cu) .... Take 1 By Mouth Qd 2)  Centrum Silver   Tabs (Multiple Vitamins-Minerals) .... Take 1 By Mouth Qd 3)  Simvastatin 80 Mg  Tabs (Simvastatin) .... Take 1 By Mouth Once Daily Keep 09/10/07 Appt For Addtional Refills 4)  Fosamax 70 Mg Tabs (Alendronate Sodium) 5)  Benicar 5 Mg  Tabs (Olmesartan Medoxomil) .... Qd 6)  Adult Aspirin Low Strength 81 Mg Tbdp (Aspirin) 7)  Darvocet-N 100 100-650 Mg Tabs (Propoxyphene N-Apap) .Marland Kitchen.. 1 Q4h As Needed Pain 8)  Promethazine Hcl 12.5 Mg Tabs (Promethazine Hcl) .Marland Kitchen.. 1 Three Times A Day As Needed Pain 9)  Vitamin D (Ergocalciferol) 50000 Unit Caps (Ergocalciferol) .Marland Kitchen.. 1 By Mouth Monthly 10)  Cephalexin 500 Mg Caps (Cephalexin) .Marland Kitchen.. 1po Three Times A Day 11)  Toprol Xl 25 Mg Xr24h-Tab (Metoprolol Succinate) .Marland Kitchen.. 1 By Mouth Every Morning 12)  Fish Oil 1000 Mg Caps (Omega-3 Fatty Acids) .Marland Kitchen.. 1 By Mouth Two Times A Day 13)  Citracal/vitamin D 250-200 Mg-Unit Tabs (Calcium Citrate-Vitamin D) .Marland Kitchen.. 1 By Mouth Two Times A Day 14)  Flax Seeds  Powd (Flaxseed (Linseed)) .... Once Daily  With Food  Allergies (verified): 1)  ! Zestril  Past History:  Past Medical History: Breast cancer, hx of Dizziness or vertigo Hyperlipidemia Hypertension Scoliosis Chronic scarring of the lung apices Current Problems:  ESOPHAGEAL STRICTURE (ICD-530.3) GERD (ICD-530.81) INTERNAL HEMORRHOIDS (ICD-455.0) DIVERTICULOSIS OF COLON (ICD-562.10) ROUTINE GENERAL MEDICAL EXAM@HEALTH  CARE FACL (ICD-V70.0) HYPERTENSION (ICD-401.9) HYPERLIPIDEMIA (ICD-272.4) DIZZINESS OR VERTIGO (ICD-780.4) BREAST CANCER, HX OF (ICD-V10.3)  nephrol: coladonato oncol: rubin  Past Surgical History: Reviewed history from 02/28/2007 and no changes required. Left Mastectomy (09/2003) Tubal ligation (1989) DEXA (10/2005) Echocardiogram (05/03/2004) EKG (05/02/2005)  Family History: Reviewed history from 09/10/2007 and no changes required. no cancer  Social History: Reviewed history from 09/10/2007 and no changes required. married retired  Review of Systems  The patient denies fever, weight loss, weight gain, vision loss, decreased hearing, chest pain, syncope, dyspnea on exertion, prolonged cough, headaches, abdominal pain, melena, hematochezia, severe indigestion/heartburn, hematuria, suspicious skin lesions, and depression.    Physical Exam  General:  normal appearance.   Head:  head: no deformity eyes: no periorbital swelling, no proptosis external nose and ears are normal mouth: no lesion seen Neck:  Supple without thyroid enlargement or tenderness.  Breasts:  left breast surgically absent.  right is normal Lungs:  Clear to auscultation bilaterally. Normal respiratory effort.  Heart:  Regular rate and rhythm without  murmurs or gallops noted. Normal S1,S2.   Abdomen:  abdomen is soft, nontender.  no hepatosplenomegaly.   not distended.  no hernia  Rectal:  normal external and internal exam.  heme neg  Genitalia:  No vaginal discharge. No vulvar lesions. no bimanual Msk:  muscle bulk  and strength are grossly normal.  no obvious joint swelling.  gait is normal and steady  Pulses:  dorsalis pedis intact bilat.  no carotid bruit  Extremities:  no deformity.  no ulcer on the feet.  feet are of normal color and temp.  no edema  Neurologic:  cn 2-12 grossly intact.   readily moves all 4's.   sensation is intact to touch on the feet  Skin:  normal texture and temp.  no rash.  not diaphoretic  Cervical Nodes:  No significant adenopathy.  Psych:  Alert and cooperative; normal mood and affect; normal attention span and concentration.     Impression & Recommendations:  Problem # 1:  ROUTINE GENERAL MEDICAL EXAM@HEALTH  CARE FACL (ICD-V70.0)  Medications Added to Medication List This Visit: 1)  Toprol Xl 25 Mg Xr24h-tab (Metoprolol succinate) .Marland Kitchen.. 1 by mouth every morning 2)  Fish Oil 1000 Mg Caps (Omega-3 fatty acids) .Marland Kitchen.. 1 by mouth two times a day 3)  Citracal/vitamin D 250-200 Mg-unit Tabs (Calcium citrate-vitamin d) .Marland Kitchen.. 1 by mouth two times a day 4)  Flax Seeds Powd (Flaxseed (linseed)) .... Once daily with food  Other Orders: EKG w/ Interpretation (93000) TLB-TSH (Thyroid Stimulating Hormone) (84443-TSH) TLB-Lipid Panel (80061-LIPID) TLB-Hepatic/Liver Function Pnl (80076-HEPATIC) Est. Patient 65& > (16109)  Patient Instructions: 1)  stop fosamax 2)  please consider these measures for your health:  minimize alcohol.  do not use tobacco products.  have a colonoscopy at least every 10 years from age 81.  keep firearms safely stored.  always use seat belts.  have working smoke alarms in your home.  see an eye doctor and dentist regularly.  never drive under the influence of alcohol or drugs (including prescription drugs).  those with fair skin should take precautions against the sun. 3)  please let me know what your wishes would be, if artificial life support measures should become necessary.  it is critically important to prevent falling down (keep floor areas well-lit,  dry, and free of loose objects) 4)  Please schedule a follow-up appointment in 1 year. 5)  (update: we discussed code status.  pt requests full code, but would not want to be started or maintained on artificial life-support measures if there was not a reasonable chance of recovery)   Orders Added: 1)  EKG w/ Interpretation [93000] 2)  TLB-TSH (Thyroid Stimulating Hormone) [84443-TSH] 3)  TLB-Lipid Panel [80061-LIPID] 4)  TLB-Hepatic/Liver Function Pnl [80076-HEPATIC] 5)  Est. Patient 65& > [60454]   Immunization History:  Pneumovax Immunization History:    Pneumovax:  historical (04/28/2008)   Immunization History:  Pneumovax Immunization History:    Pneumovax:  Historical (04/28/2008)

## 2010-08-23 NOTE — Miscellaneous (Signed)
Summary: Labs   Clinical Lists Changes  Observations: Added new observation of CALCIUM: 9.5 mg/dL (16/04/9603 54:09) Added new observation of CO2 PLSM/SER: 31 meq/L (10/29/2009 15:35) Added new observation of CL SERUM: 101 meq/L (10/29/2009 15:35) Added new observation of K SERUM: 4.2 meq/L (10/29/2009 15:35) Added new observation of NA: 139 meq/L (10/29/2009 15:35) Added new observation of CREATININE: 1.07 mg/dL (81/19/1478 29:56) Added new observation of BUN: 24 mg/dL (21/30/8657 84:69) Added new observation of BG RANDOM: 91 mg/dL (62/95/2841 32:44)      -  Date:  10/29/2009    BG Random: 91    BUN: 24    Creatinine: 1.07    Sodium: 139    Potassium: 4.2    Chloride: 101    CO2 Total: 31    Calcium: 9.5

## 2010-08-23 NOTE — Progress Notes (Signed)
Summary: [facility]- mammogram    Preventive Care Screening  Mammogram:    Date:  09/30/2009    Results:  Negative

## 2010-08-23 NOTE — Letter (Signed)
[facility]   Imported By: Sherian Rein 01/26/2010 10:13:03  _____________________________________________________________________  External Attachment:    Type:   Image     Comment:   External Document

## 2010-08-23 NOTE — Miscellaneous (Signed)
Summary: Immunization Entry   Immunization History:  Influenza Immunization History:    Influenza:  historical (04/14/2010)   Immunization History:  Influenza Immunization History:    Influenza:  historical (04/14/2010)  [facility]  Right Arm Lot #: [id number] Exp: 01/21/2011

## 2010-08-26 NOTE — Letter (Signed)
Summary: [facility]   Imported By: Lester Aguada 05/31/2010 08:09:55  _____________________________________________________________________  External Attachment:    Type:   Image     Comment:   External Document

## 2010-11-09 ENCOUNTER — Encounter: Payer: Self-pay | Admitting: Endocrinology

## 2010-11-29 ENCOUNTER — Encounter: Payer: Self-pay | Admitting: Endocrinology

## 2010-12-06 NOTE — Assessment & Plan Note (Signed)
[facility]                         GASTROENTEROLOGY OFFICE NOTE   NAME:Galloway, Autumn BALLEW                         MRN:          [id number]  DATE:03/19/2007                            DOB:          06/18/1931    PROBLEM:  Rectal bleeding.   [name] is a pleasant 77 year old white female referred through the  courtesy of Dr. Jonny Ruiz for evaluation.  On at least 2 occasions, she noted  bright red blood per rectum, consisting of a moderate amount of blood in  the toilet water.  She claims that she passed at least a tablespoon, if  not more, of bright red blood.  She has a history of hemorrhoids that  were seen at colonoscopy in 2005.  She denies change of bowel habits.  She has been taking iron for at least two years.  The stools are very  dark, which she attributes to her iron.  Previous colonoscopy also  revealed diverticulosis.   PAST MEDICAL HISTORY:  Pertinent for breast cancer diagnosed in 2005.  She has hypertension and renal insufficiency.  She is status post tubal  ligation.  She also claims to have an enlarged heart.   FAMILY HISTORY:  Pertinent for breast cancer in two siblings.  She has a  nephew from her mother's side who had breast cancer, and two nieces from  the same lineage with breast cancer.  Mother had what sound like  endometrial cancer, and she has a maternal aunt with breast cancer.   MEDICATIONS:  Include baby aspirin, anastrazole, Benicar HCTZ, iron.   ALLERGIES:  SHE HAS NO ALLERGIES.   SOCIAL HISTORY:  She neither smokes or drinks.  She is married and  retired.   REVIEW OF SYSTEMS:  Positive for some loss of hearing.   EXAMINATION:  GENERAL:  She is a healthy-appearing female.  VITAL SIGNS:  Pulse 64, blood pressure 92/60, weight 154.  HEENT: EOMI.  PERRLA.  Sclerae are anicteric.  Conjunctivae are pink.  NECK:  Supple without thyromegaly, adenopathy or carotid bruits.  CHEST:  Clear to auscultation and percussion without  adventitious  sounds.  CARDIAC:  Regular rhythm; normal S1 S2.  There are no murmurs, gallops  or rubs.  ABDOMEN:  Bowel sounds are normoactive.  Abdomen is soft, nontender and  nondistended.  There are no abdominal masses, tenderness, splenic  enlargement or hepatomegaly.  EXTREMITIES:  Full range of motion.  No cyanosis, clubbing or edema.  RECTAL:  There are no masses.  Stool is Hemoccult negative.   IMPRESSION:  1. Limited rectal bleeding.  This is most likely hemorrhoidal      bleeding, though more proximal colonic bleeding source ought to be      ruled out.  2. Family history and personal history of breast cancer.  3. Diverticulosis.  4. Renal insufficiency.   RECOMMENDATIONS:  1. Colonoscopy.  2. The patient will be referred for genetic testing because of her      strong family history of breast cancer.     Barbette Hair. Arlyce Dice, MD,FACG  Electronically Signed    RDK/MedQ  DD:  03/19/2007  DT: 03/20/2007  Job #: [id number]

## 2010-12-09 NOTE — Op Note (Signed)
NAMESHERLENE, Autumn Galloway                            ACCOUNT NO.:  [id number]   MEDICAL RECORD NO.:  [id number]                   PATIENT TYPE:  OIB   LOCATION:  2899                                 FACILITY:  [facility]   PHYSICIAN:  Vikki Ports, M.D.         DATE OF BIRTH:  20-Jul-1931   DATE OF PROCEDURE:  09/28/2003  DATE OF DISCHARGE:                                 OPERATIVE REPORT   PREOPERATIVE DIAGNOSIS:  Invasive left breast cancer.   POSTOPERATIVE DIAGNOSIS:  Invasive left breast cancer.   PROCEDURE:  Blue dye injection, lymphatic mapping, left total mastectomy,  and left axillary sentinel lymph node biopsy.   SURGEON:  Vikki Ports, M.D.   ANESTHESIA:  General.   DESCRIPTION OF PROCEDURE:  The patient was taken to the operating room and  placed in a supine position.  After adequate general anesthesia was induced  by laryngeal mask, 5-cc of Lymphazurin Blue dye were injected into the left  retroareolar position.  This was massaged in place for five minutes.  Left  axilla and left breast were then prepped and draped in the normal sterile  fashion.  Elliptical incision was made around the nipple-areolar complex,  and superior flap was fashioned to the left clavicle.  Laterally, out to the  border of the pectoralis muscle where using the Neoprobe and visualization,  I was able to identify a hot blue lymph node.  This was excised and sent for  pathologic evaluation.  There was no further activity in the axilla after  removal of this node.  It came back negative on touch prep.  At this point,  inferior flap was fashioned to the inframammary fold, medially to the  sternum, laterally to the latissimus dorsi muscle.  All tissue was taken off  the pectoralis fascia en bloc and sent to the breast center for follow-up  mammography and marking of a questionable second lesion.  It will then be  sent to pathology.  Adequate hemostasis was ensured.  Drain was placed  beneath  the flaps.  The skin was closed with staples.  Sterile dressing was  applied.  The patient tolerated the procedure well and went to PACU in good  condition.                                               Vikki Ports, M.D.    KRH/MEDQ  D:  09/28/2003  T:  09/28/2003  Job:  [id number]

## 2011-02-02 ENCOUNTER — Other Ambulatory Visit: Payer: Self-pay | Admitting: Oncology

## 2011-02-02 ENCOUNTER — Encounter (HOSPITAL_BASED_OUTPATIENT_CLINIC_OR_DEPARTMENT_OTHER): Payer: Medicare Other | Admitting: Oncology

## 2011-02-02 DIAGNOSIS — C50419 Malignant neoplasm of upper-outer quadrant of unspecified female breast: Secondary | ICD-10-CM

## 2011-02-02 DIAGNOSIS — Z170 Estrogen receptor positive status [ER+]: Secondary | ICD-10-CM

## 2011-02-02 LAB — COMPREHENSIVE METABOLIC PANEL
Albumin: 3.8 g/dL (ref 3.5–5.2)
BUN: 29 mg/dL — ABNORMAL HIGH (ref 6–23)
Calcium: 9.5 mg/dL (ref 8.4–10.5)
Chloride: 102 mEq/L (ref 96–112)
Glucose, Bld: 71 mg/dL (ref 70–99)
Potassium: 4.7 mEq/L (ref 3.5–5.3)

## 2011-02-02 LAB — VITAMIN D 25 HYDROXY (VIT D DEFICIENCY, FRACTURES): Vit D, 25-Hydroxy: 56 ng/mL (ref 30–89)

## 2011-02-02 LAB — CBC WITH DIFFERENTIAL/PLATELET
Basophils Absolute: 0 10*3/uL (ref 0.0–0.1)
Eosinophils Absolute: 0.6 10*3/uL — ABNORMAL HIGH (ref 0.0–0.5)
HCT: 36.7 % (ref 34.8–46.6)
HGB: 12.6 g/dL (ref 11.6–15.9)
MCV: 88.5 fL (ref 79.5–101.0)
NEUT#: 5.3 10*3/uL (ref 1.5–6.5)
NEUT%: 70.1 % (ref 38.4–76.8)
RDW: 12.7 % (ref 11.2–14.5)
lymph#: 1.2 10*3/uL (ref 0.9–3.3)

## 2011-02-02 LAB — LACTATE DEHYDROGENASE: LDH: 180 U/L (ref 94–250)

## 2011-02-09 ENCOUNTER — Encounter (HOSPITAL_BASED_OUTPATIENT_CLINIC_OR_DEPARTMENT_OTHER): Payer: Medicare Other | Admitting: Oncology

## 2011-05-12 IMAGING — CR DG HIP COMPLETE 2+V*R*
3 series · 3 of 3 positions shown · non-contrast
Comparison: None

CLINICAL DATA: History given of right hip pain.

RIGHT HIP - COMPLETE 2+ VIEW

[view not recorded (1 of 3)]
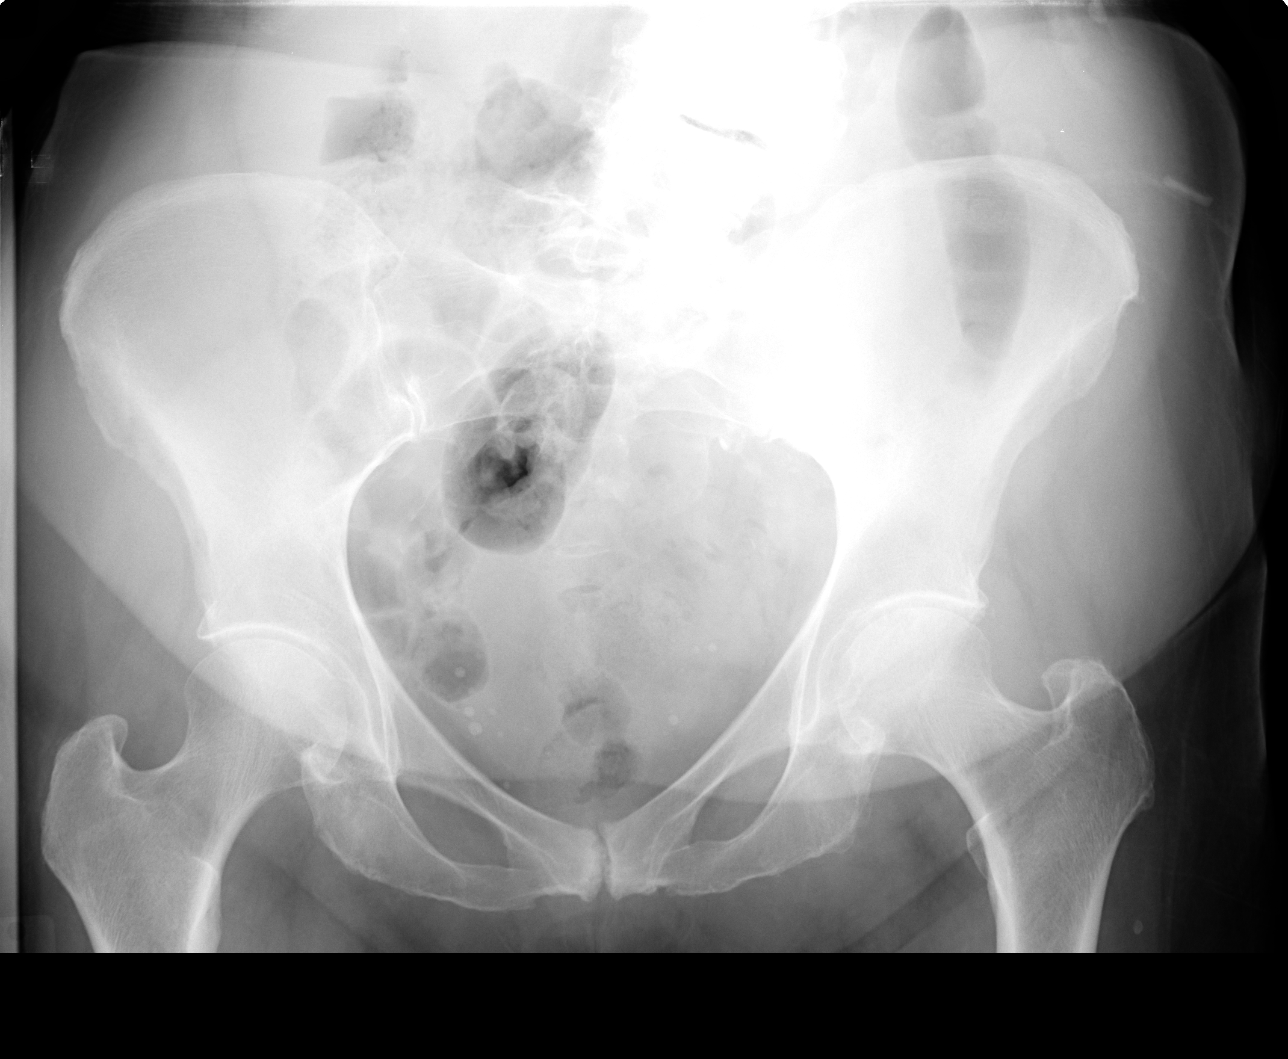

[view not recorded (2 of 3)]
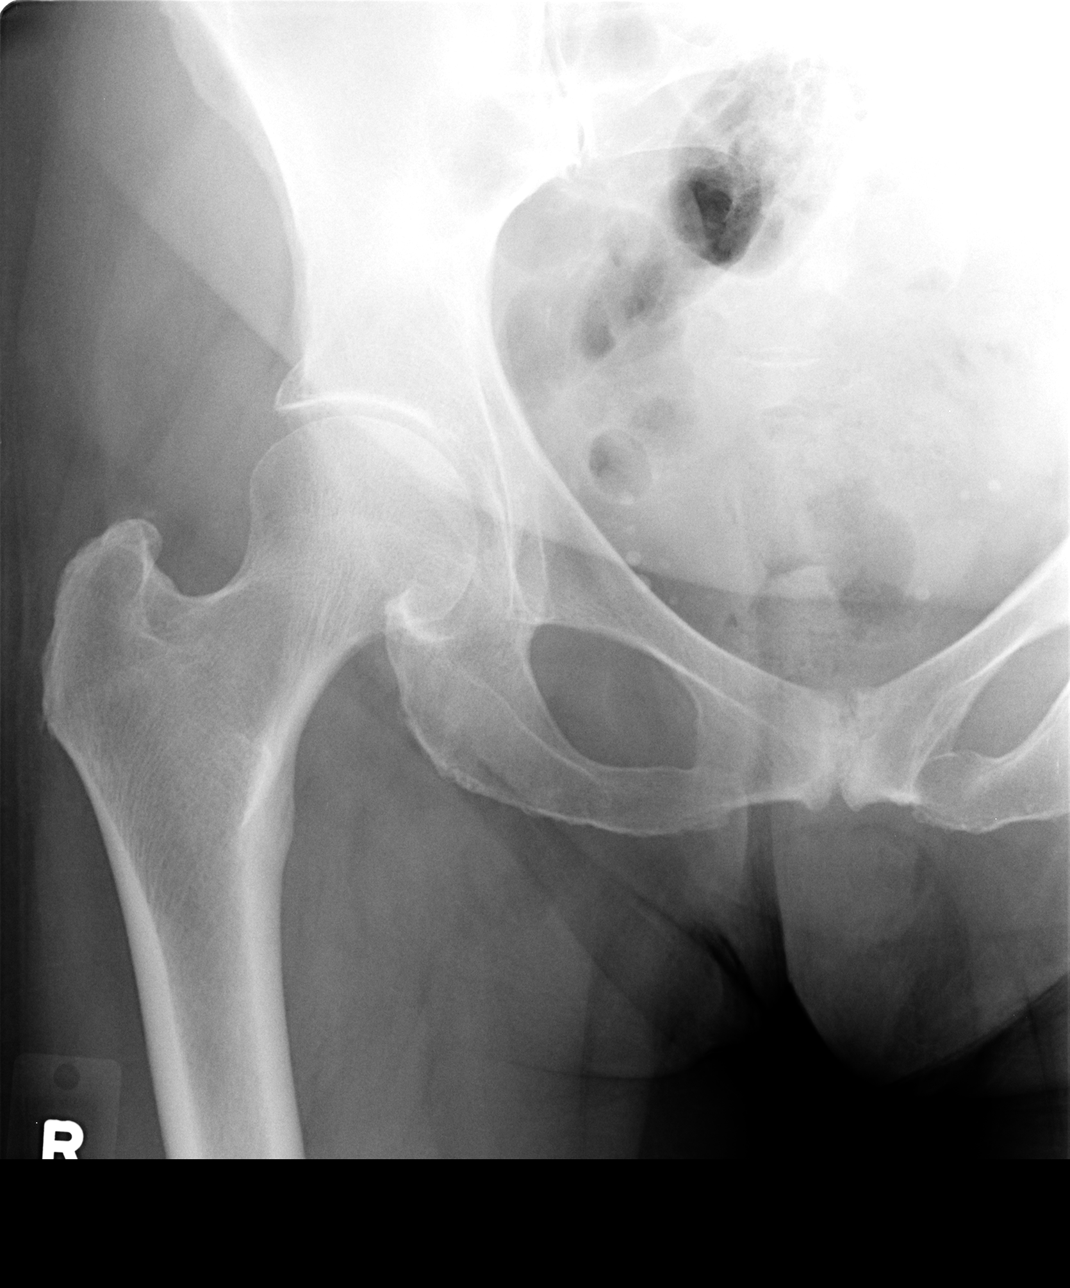

[view not recorded (3 of 3)]
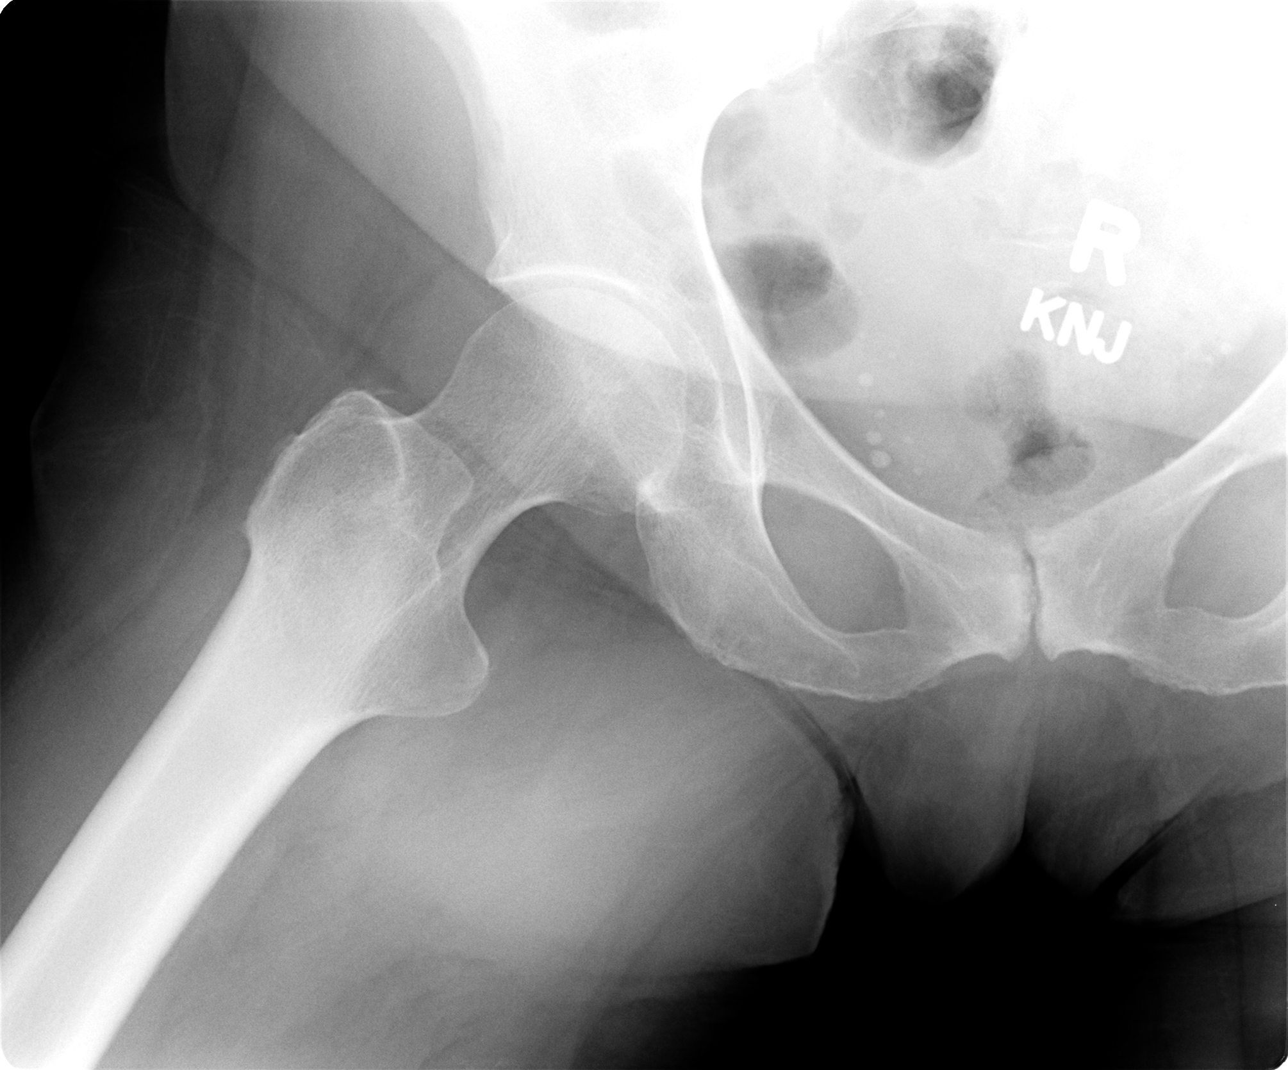

[3 of 3 positions shown; findings below may reference images not displayed]

FINDINGS: Hip joint spaces preserved.  No fracture or bony
destruction is evident.  No calcific bursitis is evident.
Phleboliths are seen in the pelvis.  There is evidence of
degenerative disc disease and degenerative spondylosis.  The lower
lumbar spine is somewhat dense.  I cannot exclude blastic disease.
IMPRESSION: No hip lesion is evident.There is evidence of degenerative disc
disease and degenerative spondylosis.  The lower lumbar spine is
somewhat dense.  I cannot exclude blastic disease.Is there any
history of breast carcinoma?

## 2011-05-15 ENCOUNTER — Telehealth: Payer: Self-pay | Admitting: *Deleted

## 2011-05-15 NOTE — Telephone Encounter (Signed)
R'cd fax from [facility]-Immunization report   Date Administered: 10/11/012 Vaccine Administered: Flurin Lot #: [id number] Exp Date: 01/21/2012 Dosage: 0.5 mL Injection Site: RA

## 2011-06-21 ENCOUNTER — Encounter: Payer: Self-pay | Admitting: Endocrinology

## 2011-06-21 ENCOUNTER — Other Ambulatory Visit (INDEPENDENT_AMBULATORY_CARE_PROVIDER_SITE_OTHER): Payer: Medicare Other

## 2011-06-21 ENCOUNTER — Other Ambulatory Visit: Payer: Self-pay | Admitting: Endocrinology

## 2011-06-21 ENCOUNTER — Ambulatory Visit (INDEPENDENT_AMBULATORY_CARE_PROVIDER_SITE_OTHER): Payer: Medicare Other | Admitting: Endocrinology

## 2011-06-21 DIAGNOSIS — M810 Age-related osteoporosis without current pathological fracture: Secondary | ICD-10-CM

## 2011-06-21 DIAGNOSIS — E785 Hyperlipidemia, unspecified: Secondary | ICD-10-CM

## 2011-06-21 DIAGNOSIS — E559 Vitamin D deficiency, unspecified: Secondary | ICD-10-CM

## 2011-06-21 DIAGNOSIS — H9190 Unspecified hearing loss, unspecified ear: Secondary | ICD-10-CM | POA: Insufficient documentation

## 2011-06-21 DIAGNOSIS — I10 Essential (primary) hypertension: Secondary | ICD-10-CM

## 2011-06-21 DIAGNOSIS — Z0000 Encounter for general adult medical examination without abnormal findings: Secondary | ICD-10-CM

## 2011-06-21 LAB — TSH: TSH: 1.97 u[IU]/mL (ref 0.35–5.50)

## 2011-06-21 LAB — LIPID PANEL
Cholesterol: 212 mg/dL — ABNORMAL HIGH (ref 0–200)
HDL: 59 mg/dL (ref >39.00)
VLDL: 28.2 mg/dL (ref 0.0–40.0)

## 2011-06-21 NOTE — Progress Notes (Signed)
Subjective:    Patient ID: Autumn Galloway, female    DOB: 20-Aug-1930, 77 y.o.   MRN: [id number]  HPI here for regular wellness examination.  He's feeling pretty well in general, and says chronic med probs are stable, except as noted below. Past Medical History  Diagnosis Date  . THROMBOCYTOPENIA 12/25/2008  . RENAL INSUFFICIENCY 12/25/2008  . OSTEOPOROSIS 06/13/2010  . INTERNAL HEMORRHOIDS 09/09/2003  . HYPERTENSION 02/28/2007  . HYPERLIPIDEMIA 02/28/2007  . GERD 05/03/1998  . ESOPHAGEAL STRICTURE 05/03/1998  . DIZZINESS OR VERTIGO 02/28/2007  . DIVERTICULOSIS OF COLON 09/09/2003  . CAROTID ARTERY STENOSIS 06/13/2010  . BREAST CANCER, HX OF 02/28/2007  . Scoliosis   . Apical lung scarring     Chronic    Past Surgical History  Procedure Date  . Tubal ligation 1989  . Mastectomy 09/2003    left  . US echocardiography 05/03/2004  . Electrocardiogram 05/02/2005    History   Social History  . Marital Status: Married    Spouse Name: N/A    Number of Children: N/A  . Years of Education: N/A   Occupational History  . Retired    Social History Main Topics  . Smoking status: Never Smoker   . Smokeless tobacco: Not on file  . Alcohol Use: Not on file  . Drug Use: Not on file  . Sexually Active: Not on file   Other Topics Concern  . Not on file   Social History Narrative  . No narrative on file    Current Outpatient Prescriptions on File Prior to Visit  Medication Sig Dispense Refill  . aspirin 81 MG tablet Take 81 mg by mouth daily.        . calcium citrate-vitamin D 200-200 MG-UNIT TABS Take 1 tablet by mouth 2 (two) times daily.        . metoprolol succinate (TOPROL-XL) 25 MG 24 hr tablet Take 25 mg by mouth every morning.        . Multiple Vitamins-Minerals (CENTRUM SILVER PO) Take 1 tablet by mouth daily.        Marland Kitchen olmesartan (BENICAR) 5 MG tablet Take 5 mg by mouth daily.        . Vitamin D, Ergocalciferol, (DRISDOL) 50000 UNITS CAPS Take 50,000 Units by mouth every 30  (thirty) days.        . ferrous fumarate-iron polysaccharide complex (TANDEM) 162-115.2 MG CAPS Take 1 capsule by mouth daily with breakfast.        . simvastatin (ZOCOR) 80 MG tablet Take 80 mg by mouth daily.          Allergies  Allergen Reactions  . Lisinopril     REACTION: Cough    Family History  Problem Relation Age of Onset  . Cancer Neg Hx     BP 110/64  Pulse 94  Temp(Src) 98.1 F (36.7 C) (Oral)  Ht 5\' 3"  (1.6 m)  Wt 145 lb (65.772 kg)  BMI 25.69 kg/m2  SpO2 96%  Review of Systems  Constitutional: Negative for fever.  Eyes: Negative for visual disturbance.  Respiratory: Negative for shortness of breath.   Cardiovascular: Negative for chest pain.  Gastrointestinal: Negative for anal bleeding.  Genitourinary: Negative for hematuria.  Musculoskeletal: Negative for back pain.  Skin: Negative for rash.  Neurological: Negative for numbness and headaches.  Hematological: Does not bruise/bleed easily.  Psychiatric/Behavioral:       She declines rx for insomnia      Objective:   Physical Exam VS:  see vs page GEN: no distress HEAD: head: no deformity eyes: no periorbital swelling, no proptosis external nose and ears are normal mouth: no lesion seen Ear: bilat hearing aids. NECK: supple, thyroid is not enlarged CHEST WALL: no deformity LUNGS:  Clear to auscultation BREASTS:  Left mastectomy.  Right breast has no mass.  No d/c ABD: abdomen is soft, nontender.  no hepatosplenomegaly.  not distended.  no hernia GENITALIA:  Normal external female.  Normal bimanual exam RECTAL: normal external and internal exam.  heme neg MUSCULOSKELETAL: muscle bulk and strength are grossly normal.  no obvious joint swelling.  gait is normal and steady EXTEMITIES: no deformity.  no ulcer on the feet.  feet are of normal color and temp.  no edema NEURO:  cn 2-12 grossly intact.   readily moves all 4's.  sensation is intact to touch on the feet SKIN:  Normal texture and  temperature.  No rash or suspicious lesion is visible.   NODES:  None palpable at the neck PSYCH: alert, oriented x3.  Does not appear anxious nor depressed.      Assessment & Plan:  Wellness visit today, with problems stable, except as noted.     SEPARATE EVALUATION FOLLOWS--EACH PROBLEM HERE IS NEW, NOT RESPONDING TO TREATMENT, OR POSES SIGNIFICANT RISK TO THE PATIENT'S HEALTH: HISTORY OF THE PRESENT ILLNESS: Pt is noted to have dyslipidemia.  She reports weight gain PAST MEDICAL HISTORY reviewed and up to date today REVIEW OF SYSTEMS: Denies syncope PHYSICAL EXAMINATION: VITAL SIGNS:  See vs page GENERAL: no distress Pulses: dorsalis pedis intact bilat.  no carotid bruit HEART:  Regular rate and rhythm without murmurs noted. Normal S1,S2.   LAB/XRAY RESULTS: Lab Results  Component Value Date   CHOL 212* 06/21/2011   HDL 59.00 06/21/2011   LDLCALC 77 06/13/2010   LDLDIRECT 127.8 06/21/2011   TRIG 141.0 06/21/2011   CHOLHDL 4 06/21/2011  IMPRESSION: Dyslipidemia, needs increased rx PLAN: See instruction page

## 2011-06-21 NOTE — Patient Instructions (Addendum)
blood tests are being requested for you today.  please call [phone] to hear your test results.  You will be prompted to enter the 9-digit "MRN" number that appears at the top left of this page, followed by #.  Then you will hear the message. please consider these measures for your health:  minimize alcohol.  do not use tobacco products.  have a colonoscopy at least every 10 years from age 75.  Women should have an annual mammogram from age 33.  keep firearms safely stored.  always use seat belts.  have working smoke alarms in your home.  see an eye doctor and dentist regularly.  never drive under the influence of alcohol or drugs (including prescription drugs).  those with fair skin should take precautions against the sun. please let me know what your wishes would be, if artificial life support measures should become necessary.  it is critically important to prevent falling down (keep floor areas well-lit, dry, and free of loose objects.  If you have a cane, walker, or wheelchair, you should use it, even for short trips around the house.  Also, try not to rush) You should have a vaccine against shingles (a painful rash which results from the  chickenpox infection which most people had many years ago).  This vaccine reduces, but does not totally eliminate the risk of shingles.  Because this is a medicare part d benefit, there are 3 ways you can get it:  You can go to a pharmacy and get the injection (I can give you a prescription), or I can give you a prescription to have filled at a pharmacy, and bring back here for [place] to give.  The other option is that you can pay up-front for it, and we'll give you a form to make a claim for reimbursement from your medicare part d carrier. Please return in 1 year. (update: pt requests DNR) (update: i left message on phone-tree:  Please verify you are taking zocor).

## 2011-06-28 ENCOUNTER — Telehealth: Payer: Self-pay | Admitting: Oncology

## 2011-06-28 NOTE — Telephone Encounter (Signed)
per pof 07/19 called pt to scheduled appts for [id number] and pt did not answer phone nor did she have a vm.  will mail appts to her home

## 2011-06-28 NOTE — Telephone Encounter (Signed)
scheduled pt for mammogram on 10/30/11 @ [facility].  mailed that appt along with [id number] appts to pt

## 2012-02-06 ENCOUNTER — Other Ambulatory Visit: Payer: Self-pay | Admitting: Oncology

## 2012-02-06 ENCOUNTER — Other Ambulatory Visit (HOSPITAL_BASED_OUTPATIENT_CLINIC_OR_DEPARTMENT_OTHER): Payer: Medicare Other | Admitting: Lab

## 2012-02-06 DIAGNOSIS — C50919 Malignant neoplasm of unspecified site of unspecified female breast: Secondary | ICD-10-CM

## 2012-02-06 LAB — CBC WITH DIFFERENTIAL/PLATELET
Basophils Absolute: 0 10*3/uL (ref 0.0–0.1)
Eosinophils Absolute: 0.7 10*3/uL — ABNORMAL HIGH (ref 0.0–0.5)
HGB: 10.9 g/dL — ABNORMAL LOW (ref 11.6–15.9)
MONO#: 0.6 10*3/uL (ref 0.1–0.9)
NEUT#: 5.7 10*3/uL (ref 1.5–6.5)
RDW: 14.4 % (ref 11.2–14.5)
lymph#: 1.5 10*3/uL (ref 0.9–3.3)

## 2012-02-07 LAB — COMPREHENSIVE METABOLIC PANEL
AST: 29 U/L (ref 0–37)
Albumin: 3.8 g/dL (ref 3.5–5.2)
BUN: 21 mg/dL (ref 6–23)
Calcium: 9.5 mg/dL (ref 8.4–10.5)
Chloride: 102 mEq/L (ref 96–112)
Glucose, Bld: 81 mg/dL (ref 70–99)
Potassium: 4.5 mEq/L (ref 3.5–5.3)

## 2012-02-13 ENCOUNTER — Ambulatory Visit (HOSPITAL_BASED_OUTPATIENT_CLINIC_OR_DEPARTMENT_OTHER): Payer: Medicare Other | Admitting: Oncology

## 2012-02-13 ENCOUNTER — Telehealth: Payer: Self-pay | Admitting: Oncology

## 2012-02-13 ENCOUNTER — Ambulatory Visit (HOSPITAL_BASED_OUTPATIENT_CLINIC_OR_DEPARTMENT_OTHER): Payer: Medicare Other | Admitting: Lab

## 2012-02-13 VITALS — BP 100/60 | HR 77 | Temp 97.5°F | Ht 63.0 in | Wt 144.9 lb

## 2012-02-13 DIAGNOSIS — D649 Anemia, unspecified: Secondary | ICD-10-CM

## 2012-02-13 DIAGNOSIS — Z853 Personal history of malignant neoplasm of breast: Secondary | ICD-10-CM

## 2012-02-13 LAB — CBC & DIFF AND RETIC
Basophils Absolute: 0 10*3/uL (ref 0.0–0.1)
Eosinophils Absolute: 0.8 10*3/uL — ABNORMAL HIGH (ref 0.0–0.5)
HCT: 33.8 % — ABNORMAL LOW (ref 34.8–46.6)
HGB: 11.2 g/dL — ABNORMAL LOW (ref 11.6–15.9)
LYMPH%: 19.9 % (ref 14.0–49.7)
MCV: 83 fL (ref 79.5–101.0)
MONO%: 7.3 % (ref 0.0–14.0)
NEUT#: 5.3 10*3/uL (ref 1.5–6.5)
NEUT%: 63 % (ref 38.4–76.8)
Platelets: 308 10*3/uL (ref 145–400)
RDW: 14.1 % (ref 11.2–14.5)
Retic %: 1.41 % (ref 0.70–2.10)

## 2012-02-13 LAB — CHCC SMEAR

## 2012-02-13 NOTE — Progress Notes (Signed)
Hematology and Oncology Follow Up Visit  EVOLETTE PENDELL [id number] 27-Mar-1931 78 y.o. 02/13/2012 10:14 AM   DIAGNOSIS:   PROBLEM: 1. Multifocal tubulolobular cancer status post mastectomy, ER positive, PR negative, on Arimidex on MA study since May 2005, completed five years of Arimidex. 2. History of hypertension. 3. History of renal insufficiency  PAST THERAPY:  She has been doing well. She has no current complaints. She's been getting her and mammography performed.  Interim History:  Medications: I have reviewed the patient's current medications.  Allergies:  Allergies  Allergen Reactions  . Lisinopril     REACTION: Cough    Past Medical History, Surgical history, Social history, and Family History were reviewed and updated.  Review of Systems: Constitutional:  Negative for fever, chills, night sweats, anorexia, weight loss, pain. Cardiovascular: no chest pain or dyspnea on exertion Respiratory: negative Neurological: negative Dermatological: negative ENT: negative Skin Gastrointestinal: negative Genito-Urinary: negative Hematological and Lymphatic: negative Breast: negative Musculoskeletal: negative Remaining ROS negative.  Physical Exam:  Blood pressure 100/60, pulse 77, temperature 97.5 F (36.4 C), height 5\' 3"  (1.6 m), weight 144 lb 14.4 oz (65.726 kg).  ECOG: 0 HEENT:  Sclerae anicteric, conjunctivae pink.  Oropharynx clear.  No mucositis or candidiasis.  Nodes:  No cervical, supraclavicular, or axillary lymphadenopathy palpated.  Breast Exam:  Right breast is benign.  No masses, discharge, skin change, or nipple inversion.  Left breast is status post mastectomy chest wall exam is unremarkable Lungs:  Clear to auscultation bilaterally.  No crackles, rhonchi, or wheezes.  Heart:  Regular rate and rhythm.  Abdomen:  Soft, nontender.  Positive bowel sounds.  No organomegaly or masses palpated.  Musculoskeletal:  No focal spinal tenderness to palpation.   Extremities:  Benign.  No peripheral edema or cyanosis.  Skin:  Benign.  Neuro:  Nonfocal.     Lab Results: Lab Results  Component Value Date   WBC 8.6 02/06/2012   HGB 10.9* 02/06/2012   HCT 33.3* 02/06/2012   MCV 83.4 02/06/2012   PLT 287 02/06/2012     Chemistry      Component Value Date/Time   NA 137 02/06/2012 0934   K 4.5 02/06/2012 0934   CL 102 02/06/2012 0934   CO2 31 02/06/2012 0934   BUN 21 02/06/2012 0934   CREATININE 1.11* 02/06/2012 0934      Component Value Date/Time   CALCIUM 9.5 02/06/2012 0934   ALKPHOS 58 02/06/2012 0934   AST 29 02/06/2012 0934   ALT 18 02/06/2012 0934   BILITOT 0.5 02/06/2012 0934       Radiological Studies:  No results found.   IMPRESSIONS AND PLAN: A 78 y.o. 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 She is approximately 8 years out from diagnosis. She has mild renal insufficiency. She has anemia and no obvious today there was a year ago. She has been on iron replacement in the past. We will recheck iron stores today. She does not think she's had a colonoscopy in some time. We will see her in a years time and I will notify her about her anemia results.  Spent more than half the time coordinating care, as well as discussion of BMI and its implications.      Ameisha Mcclellan 7/23/201310:14 AM Cell [phone]

## 2012-02-13 NOTE — Telephone Encounter (Signed)
gve the pt her July 2014 appt calendar °

## 2012-02-14 LAB — IRON AND TIBC
Iron: 67 ug/dL (ref 42–145)
UIBC: 302 ug/dL (ref 125–400)

## 2012-02-14 LAB — SEDIMENTATION RATE: Sed Rate: 9 mm/hr (ref 0–22)

## 2012-02-21 ENCOUNTER — Encounter: Payer: Self-pay | Admitting: Oncology

## 2012-02-22 ENCOUNTER — Encounter: Payer: Self-pay | Admitting: *Deleted

## 2012-02-22 NOTE — Progress Notes (Signed)
Mailed after appt letter to pt. 

## 2012-03-06 ENCOUNTER — Other Ambulatory Visit: Payer: Self-pay | Admitting: Endocrinology

## 2012-04-09 ENCOUNTER — Encounter: Payer: Self-pay | Admitting: Gastroenterology

## 2012-06-25 ENCOUNTER — Encounter: Payer: Self-pay | Admitting: Gastroenterology

## 2012-06-26 ENCOUNTER — Encounter: Payer: Medicare Other | Admitting: Endocrinology

## 2012-07-01 ENCOUNTER — Ambulatory Visit (INDEPENDENT_AMBULATORY_CARE_PROVIDER_SITE_OTHER): Payer: Medicare Other | Admitting: Endocrinology

## 2012-07-01 VITALS — BP 126/78 | HR 73 | Temp 97.3°F | Wt 146.0 lb

## 2012-07-01 DIAGNOSIS — Z79899 Other long term (current) drug therapy: Secondary | ICD-10-CM | POA: Insufficient documentation

## 2012-07-01 DIAGNOSIS — E785 Hyperlipidemia, unspecified: Secondary | ICD-10-CM

## 2012-07-01 DIAGNOSIS — M810 Age-related osteoporosis without current pathological fracture: Secondary | ICD-10-CM

## 2012-07-01 DIAGNOSIS — N259 Disorder resulting from impaired renal tubular function, unspecified: Secondary | ICD-10-CM

## 2012-07-01 DIAGNOSIS — I10 Essential (primary) hypertension: Secondary | ICD-10-CM

## 2012-07-01 DIAGNOSIS — Z0000 Encounter for general adult medical examination without abnormal findings: Secondary | ICD-10-CM

## 2012-07-01 DIAGNOSIS — I6529 Occlusion and stenosis of unspecified carotid artery: Secondary | ICD-10-CM

## 2012-07-01 NOTE — Patient Instructions (Addendum)
please consider these measures for your health:  minimize alcohol.  do not use tobacco products.  have a colonoscopy at least every 10 years from age 76.  Women should have an annual mammogram from age 38.  keep firearms safely stored.  always use seat belts.  have working smoke alarms in your home.  see an eye doctor and dentist regularly.  never drive under the influence of alcohol or drugs (including prescription drugs).  those with fair skin should take precautions against the sun. please let me know what your wishes would be, if artificial life support measures should become necessary.  it is critically important to prevent falling down (keep floor areas well-lit, dry, and free of loose objects.  If you have a cane, walker, or wheelchair, you should use it, even for short trips around the house.  Also, try not to rush).   Please return in 1 year.  You should have a vaccine against shingles (a painful rash which results from the  chickenpox infection which most people had many years ago).  This vaccine reduces, but does not totally eliminate the risk of shingles.  Because this is a medicare part d benefit, you should get it at a pharmacy.

## 2012-07-01 NOTE — Progress Notes (Signed)
Subjective:    Patient ID: Autumn Galloway, female    DOB: 05-May-1931, 78 y.o.   MRN: [id number]  HPI here for regular wellness examination.  He's feeling pretty well in general, and says chronic med probs are stable, except as noted below Past Medical History  Diagnosis Date  . THROMBOCYTOPENIA 12/25/2008  . RENAL INSUFFICIENCY 12/25/2008  . OSTEOPOROSIS 06/13/2010  . INTERNAL HEMORRHOIDS 09/09/2003  . HYPERTENSION 02/28/2007  . HYPERLIPIDEMIA 02/28/2007  . GERD 05/03/1998  . ESOPHAGEAL STRICTURE 05/03/1998  . DIZZINESS OR VERTIGO 02/28/2007  . DIVERTICULOSIS OF COLON 09/09/2003  . CAROTID ARTERY STENOSIS 06/13/2010  . BREAST CANCER, HX OF 02/28/2007  . Scoliosis   . Apical lung scarring     Chronic    Past Surgical History  Procedure Date  . Tubal ligation 1989  . Mastectomy 09/2003    left  . US echocardiography 05/03/2004  . Electrocardiogram 05/02/2005    History   Social History  . Marital Status: Widowed    Spouse Name: N/A    Number of Children: N/A  . Years of Education: N/A   Occupational History  . Retired    Social History Main Topics  . Smoking status: Never Smoker   . Smokeless tobacco: Not on file  . Alcohol Use: Not on file  . Drug Use: Not on file  . Sexually Active: Not on file   Other Topics Concern  . Not on file   Social History Narrative  . No narrative on file    Current Outpatient Prescriptions on File Prior to Visit  Medication Sig Dispense Refill  . aspirin 81 MG tablet Take 81 mg by mouth daily.        . calcium citrate-vitamin D 200-200 MG-UNIT TABS Take 1 tablet by mouth 2 (two) times daily.        . metoprolol succinate (TOPROL-XL) 25 MG 24 hr tablet Take 25 mg by mouth every morning.        . Multiple Vitamins-Minerals (CENTRUM SILVER PO) Take 1 tablet by mouth daily.        Marland Kitchen olmesartan (BENICAR) 5 MG tablet Take 5 mg by mouth daily.        . simvastatin (ZOCOR) 80 MG tablet TAKE 1 TABLET BY MOUTH ONCE DAILY  90 tablet  1  . Vitamin D,  Ergocalciferol, (DRISDOL) 50000 UNITS CAPS Take 50,000 Units by mouth every 30 (thirty) days.          Allergies  Allergen Reactions  . Lisinopril     REACTION: Cough    Family History  Problem Relation Age of Onset  . Cancer Neg Hx     BP 126/78  Pulse 73  Temp 97.3 F (36.3 C) (Oral)  Wt 146 lb (66.225 kg)  SpO2 97%     Review of Systems  Constitutional: Negative for fever and unexpected weight change.  HENT: Negative for rhinorrhea.   Eyes: Negative for visual disturbance.  Respiratory: Negative for shortness of breath.   Cardiovascular: Negative for chest pain.  Gastrointestinal: Negative for abdominal pain.  Genitourinary: Negative for hematuria and vaginal bleeding.  Musculoskeletal: Negative for back pain.  Skin: Negative for rash.  Neurological: Negative for syncope and numbness.  Hematological: Does not bruise/bleed easily.  Psychiatric/Behavioral: Negative for dysphoric mood.       Objective:   Physical Exam VS: see vs page GEN: no distress HEAD: head: no deformity eyes: no periorbital swelling, no proptosis external nose and ears are normal mouth: no  lesion seen NECK: supple, thyroid is not enlarged CHEST WALL: no deformity LUNGS:  Clear to auscultation BREASTS:  declined CV: reg rate and rhythm, no murmur ABD: abdomen is soft, nontender.  no hepatosplenomegaly.  not distended.  no hernia GENITALIA/RECTAL: declined MUSCULOSKELETAL: muscle bulk and strength are grossly normal.  no obvious joint swelling.  gait is normal and steady EXTEMITIES: no deformity.  no ulcer on the feet.  feet are of normal color and temp.  no edema PULSES: dorsalis pedis intact bilat.  no carotid bruit NEURO:  cn 2-12 grossly intact.   readily moves all 4's.  sensation is intact to touch on the feet SKIN:  Normal texture and temperature.  No rash or suspicious lesion is visible.   NODES:  None palpable at the neck PSYCH: alert, oriented x3.  Does not appear anxious nor  depressed.      Assessment & Plan:  Wellness visit today, with problems stable, except as noted.

## 2012-07-05 LAB — HEPATIC FUNCTION PANEL
ALT: 19 U/L (ref 0–35)
Albumin: 3.7 g/dL (ref 3.5–5.2)
Indirect Bilirubin: 0.3 mg/dL (ref 0.0–0.9)
Total Protein: 6.3 g/dL (ref 6.0–8.3)

## 2012-07-05 LAB — TSH: TSH: 3.084 u[IU]/mL (ref 0.350–4.500)

## 2012-07-05 LAB — BASIC METABOLIC PANEL
CO2: 31 mEq/L (ref 19–32)
Calcium: 9.1 mg/dL (ref 8.4–10.5)
Glucose, Bld: 86 mg/dL (ref 70–99)
Potassium: 4.3 mEq/L (ref 3.5–5.3)
Sodium: 139 mEq/L (ref 135–145)

## 2012-07-05 LAB — LIPID PANEL
HDL: 59 mg/dL (ref >39)
LDL Cholesterol: 56 mg/dL (ref 0–99)

## 2012-07-06 LAB — URINALYSIS, ROUTINE W REFLEX MICROSCOPIC
Bilirubin Urine: NEGATIVE
Glucose, UA: NEGATIVE mg/dL
Leukocytes, UA: NEGATIVE
Specific Gravity, Urine: 1.021 (ref 1.005–1.030)
Urobilinogen, UA: 0.2 mg/dL (ref 0.0–1.0)

## 2012-07-08 LAB — PTH, INTACT AND CALCIUM
Calcium, Total (PTH): 9.1 mg/dL (ref 8.4–10.5)
PTH: 31.8 pg/mL (ref 14.0–72.0)

## 2012-07-30 ENCOUNTER — Encounter: Payer: Self-pay | Admitting: *Deleted

## 2012-07-30 NOTE — Progress Notes (Signed)
Patient ID: Autumn Galloway, female   DOB: 01/05/1931, 79 y.o.   MRN: [id number] Pt called and scheduled herself for recall colon after getting recall letter.  Per pt's recall sheet in chart she does not need recall colon.  I called to talk to pt about this and she says she is having rectal bleeding and thought she was scheduled for office visit with Dr. Arlyce Dice.  I cancelled colonoscopy scheduled for 1/23 and scheduled pt for new patient appt with Dr. Arlyce Dice on 08/01/2012 at 10:00.

## 2012-08-01 ENCOUNTER — Encounter: Payer: Self-pay | Admitting: Gastroenterology

## 2012-08-01 ENCOUNTER — Ambulatory Visit (INDEPENDENT_AMBULATORY_CARE_PROVIDER_SITE_OTHER): Payer: Medicare Other | Admitting: Gastroenterology

## 2012-08-01 VITALS — BP 118/64 | HR 68 | Ht 62.5 in | Wt 150.4 lb

## 2012-08-01 DIAGNOSIS — K648 Other hemorrhoids: Secondary | ICD-10-CM

## 2012-08-01 MED ORDER — HYDROCORTISONE ACETATE 25 MG RE SUPP: 25.0000 mg | Freq: Two times a day (BID) | RECTAL | Status: DC

## 2012-08-01 NOTE — Progress Notes (Signed)
History of Present Illness: Is a 79 year old white female referred for evaluation of rectal bleeding. For years she has had intermittent rectal bleeding consisting of bright red blood coloring the toilet water. She denies rectal or abdominal pain. Colonoscopy in 2008 for rectal bleeding demonstrated diverticulosis and hemorrhoids. Patient inquired regarding procedures for hemorrhoids.    Past Medical History  Diagnosis Date  . THROMBOCYTOPENIA 12/25/2008  . RENAL INSUFFICIENCY 12/25/2008  . OSTEOPOROSIS 06/13/2010  . INTERNAL HEMORRHOIDS 09/09/2003  . HYPERTENSION 02/28/2007  . HYPERLIPIDEMIA 02/28/2007  . GERD 05/03/1998  . ESOPHAGEAL STRICTURE 05/03/1998  . DIZZINESS OR VERTIGO 02/28/2007  . DIVERTICULOSIS OF COLON 09/09/2003  . CAROTID ARTERY STENOSIS 06/13/2010  . BREAST CANCER, HX OF 02/28/2007  . Scoliosis   . Apical lung scarring     Chronic  . Vitamin D deficiency   . Hearing loss    Past Surgical History  Procedure Date  . Tubal ligation 1989  . Mastectomy 09/2003    left  . US echocardiography 05/03/2004  . Electrocardiogram 05/02/2005  . Carpal tunnel release     bilateral  . Tonsillectomy    family history includes Breast cancer in her maternal aunt, other, and sister.  There is no history of Cancer. Current Outpatient Prescriptions  Medication Sig Dispense Refill  . aspirin 81 MG tablet Take 81 mg by mouth daily.        . calcium citrate-vitamin D 200-200 MG-UNIT TABS Take 1 tablet by mouth 2 (two) times daily.        . metoprolol succinate (TOPROL-XL) 25 MG 24 hr tablet Take 25 mg by mouth every morning.        . Multiple Vitamins-Minerals (CENTRUM SILVER PO) Take 1 tablet by mouth daily.        Marland Kitchen olmesartan (BENICAR) 5 MG tablet Take 5 mg by mouth daily.        . simvastatin (ZOCOR) 80 MG tablet TAKE 1 TABLET BY MOUTH ONCE DAILY  90 tablet  1  . Vitamin D, Ergocalciferol, (DRISDOL) 50000 UNITS CAPS Take 50,000 Units by mouth every 30 (thirty) days.         Allergies as  of 08/01/2012 - Review Complete 08/01/2012  Allergen Reaction Noted  . Lisinopril      reports that she has never smoked. She has never used smokeless tobacco. She reports that she does not drink alcohol or use illicit drugs.     Review of Systems: Pertinent positive and negative review of systems were noted in the above HPI section. All other review of systems were otherwise negative.  Vital signs were reviewed in today's medical record Physical Exam: General: Well developed , well nourished, no acute distress Head: Normocephalic and atraumatic Eyes:  sclerae anicteric, EOMI Ears: Normal auditory acuity Mouth: No deformity or lesions Neck: Supple, no masses or thyromegaly Lungs: Clear throughout to auscultation Heart: Regular rate and rhythm; no murmurs, rubs or bruits Abdomen: Soft, non tender and non distended. No masses, hepatosplenomegaly or hernias noted. Normal Bowel sounds Rectal: External skin tags are present Musculoskeletal: Symmetrical with no gross deformities  Skin: No lesions on visible extremities Pulses:  Normal pulses noted Extremities: No clubbing, cyanosis, edema or deformities noted Neurological: Alert oriented x 4, grossly nonfocal Cervical Nodes:  No significant cervical adenopathy Inguinal Nodes: No significant inguinal adenopathy Psychological:  Alert and cooperative. Normal mood and affect

## 2012-08-01 NOTE — Patient Instructions (Addendum)
Follow up as needed

## 2012-08-01 NOTE — Assessment & Plan Note (Addendum)
The patient's rectal bleeding is undoubtedly hemorrhoidal. I discussed treatment options with her including suppositories, hemorrhoidal banding and surgery. Since any of the therapies were not a guarantee that bleeding would entirelty subside she did not want to pursue anything beyond suppositories for now. Accordingly, I will prescribe Anusol a.c. suppositories only.

## 2012-08-15 ENCOUNTER — Encounter: Payer: Medicare Other | Admitting: Gastroenterology

## 2012-09-14 ENCOUNTER — Encounter: Payer: Self-pay | Admitting: Oncology

## 2012-09-14 ENCOUNTER — Telehealth: Payer: Self-pay | Admitting: Oncology

## 2012-09-14 NOTE — Telephone Encounter (Signed)
s.w. pt and advised on new Dr. Gaylyn Rong...pt ok...sent pt appt schedule and letter

## 2012-10-28 ENCOUNTER — Telehealth: Payer: Self-pay | Admitting: Internal Medicine

## 2012-10-28 NOTE — Telephone Encounter (Signed)
Due to [facility] will be out the last 2 weeks in July pt 7/24 appt moved to [facility]. S/w pt today re change w/d/t for 7/24 @ 10 am. Also confirmed 7/17 appt and mailed schedule. Per pt request address changed to [address] [place], [place], [place] [address]. Former PR pt reassigned from [facility] to [facility].

## 2012-12-05 ENCOUNTER — Telehealth: Payer: Self-pay | Admitting: Endocrinology

## 2012-12-05 NOTE — Telephone Encounter (Signed)
Received 4 pages from [facility] on 12/05/2012 asw  Sent 4 pages to Dr. Romero Belling to review, by Inter[facility] Mail on 12/05/2012 asw

## 2012-12-31 ENCOUNTER — Ambulatory Visit (INDEPENDENT_AMBULATORY_CARE_PROVIDER_SITE_OTHER): Payer: Medicare Other | Admitting: Endocrinology

## 2012-12-31 ENCOUNTER — Encounter: Payer: Self-pay | Admitting: Endocrinology

## 2012-12-31 ENCOUNTER — Ambulatory Visit
Admission: RE | Admit: 2012-12-31 | Discharge: 2012-12-31 | Disposition: A | Discharge status: Home / Self Care | Payer: Medicare Other | Source: Ambulatory Visit | Attending: Endocrinology | Admitting: Endocrinology

## 2012-12-31 VITALS — BP 130/70 | HR 80 | Ht 63.0 in | Wt 141.0 lb

## 2012-12-31 DIAGNOSIS — M25519 Pain in unspecified shoulder: Secondary | ICD-10-CM

## 2012-12-31 DIAGNOSIS — M25511 Pain in right shoulder: Secondary | ICD-10-CM

## 2012-12-31 MED ORDER — TRAMADOL-ACETAMINOPHEN 37.5-325 MG PO TABS: 1.0000 | ORAL_TABLET | Freq: Four times a day (QID) | ORAL | Status: DC | PRN

## 2012-12-31 NOTE — Patient Instructions (Addendum)
Autumn Galloway are being requested for you today.  We'll contact you with results. i have sent a prescription to your pharmacy, for a pain pill I hope you feel better soon.  If you don't feel better in the next few days, please call back.

## 2012-12-31 NOTE — Progress Notes (Signed)
Subjective:    Patient ID: Autumn Galloway, female    DOB: June 25, 1931, 79 y.o.   MRN: [id number]  HPI Pt states 3 days of moderate pain at the right shoulder, not in the context of any injury.  No assoc numbness.  She has never had problems there before.  Pain is worsened by moving RUE forward or upward.   Past Medical History  Diagnosis Date  . THROMBOCYTOPENIA 12/25/2008  . RENAL INSUFFICIENCY 12/25/2008  . OSTEOPOROSIS 06/13/2010  . INTERNAL HEMORRHOIDS 09/09/2003  . HYPERTENSION 02/28/2007  . HYPERLIPIDEMIA 02/28/2007  . GERD 05/03/1998  . ESOPHAGEAL STRICTURE 05/03/1998  . DIZZINESS OR VERTIGO 02/28/2007  . DIVERTICULOSIS OF COLON 09/09/2003  . CAROTID ARTERY STENOSIS 06/13/2010  . BREAST CANCER, HX OF 02/28/2007  . Scoliosis   . Apical lung scarring     Chronic  . Vitamin D deficiency   . Hearing loss     Past Surgical History  Procedure Laterality Date  . Tubal ligation  1989  . Mastectomy  09/2003    left  . US echocardiography  05/03/2004  . Electrocardiogram  05/02/2005  . Carpal tunnel release      bilateral  . Tonsillectomy      History   Social History  . Marital Status: Widowed    Spouse Name: N/A    Number of Children: 4  . Years of Education: N/A   Occupational History  . Retired    Social History Main Topics  . Smoking status: Never Smoker   . Smokeless tobacco: Never Used  . Alcohol Use: No  . Drug Use: No  . Sexually Active: Not on file   Other Topics Concern  . Not on file   Social History Narrative  . No narrative on file    Current Outpatient Prescriptions on File Prior to Visit  Medication Sig Dispense Refill  . aspirin 81 MG tablet Take 81 mg by mouth daily.        . calcium citrate-vitamin D 200-200 MG-UNIT TABS Take 1 tablet by mouth 2 (two) times daily.        . hydrocortisone (ANUSOL-HC) 25 MG suppository Place 1 suppository (25 mg total) rectally every 12 (twelve) hours.  12 suppository  1  . metoprolol succinate (TOPROL-XL) 25 MG 24 hr  tablet Take 25 mg by mouth every morning.        . Multiple Vitamins-Minerals (CENTRUM SILVER PO) Take 1 tablet by mouth daily.        Marland Kitchen olmesartan (BENICAR) 5 MG tablet Take 5 mg by mouth daily.        . simvastatin (ZOCOR) 80 MG tablet TAKE 1 TABLET BY MOUTH ONCE DAILY  90 tablet  1  . Vitamin D, Ergocalciferol, (DRISDOL) 50000 UNITS CAPS Take 50,000 Units by mouth every 30 (thirty) days.         No current facility-administered medications on file prior to visit.    Allergies  Allergen Reactions  . Lisinopril     REACTION: Cough    Family History  Problem Relation Age of Onset  . Cancer Neg Hx   . Breast cancer Sister     x 2  . Breast cancer Maternal Aunt   . Breast cancer Other     nephew    BP 130/70  Pulse 80  Ht 5\' 3"  (1.6 m)  Wt 141 lb (63.957 kg)  BMI 24.98 kg/m2  SpO2 96%    Review of Systems Denies any other RUE pain  and rash    Objective:   Physical Exam VITAL SIGNS:  See vs page GENERAL: no distress Right shoulder: rom severely limited by pain.  Tender anteriorly. Neuro: sensation is intact to touch on the RUE RUE: no deformity. normal color and temp.  Motor 5/5 Pulses: right radial is intact   (i reviewed x-ray results)    Assessment & Plan:  Shoulder pain, new, possibly due to OA.  Best option is ultram, as she has fall risk from narcotics. Renal insufficiency: this increases the risk of NSAID

## 2013-01-27 ENCOUNTER — Telehealth: Payer: Self-pay | Admitting: Internal Medicine

## 2013-01-27 NOTE — Telephone Encounter (Signed)
pt called to r/s appt to Sept....no explanation.Marland KitchenMarland KitchenMarland KitchenDone

## 2013-02-06 ENCOUNTER — Other Ambulatory Visit: Payer: Medicare Other | Admitting: Lab

## 2013-02-13 ENCOUNTER — Ambulatory Visit: Payer: Medicare Other | Admitting: Internal Medicine

## 2013-02-13 ENCOUNTER — Ambulatory Visit: Payer: Medicare Other | Admitting: Oncology

## 2013-04-10 ENCOUNTER — Other Ambulatory Visit: Payer: Self-pay | Admitting: Medical Oncology

## 2013-04-10 ENCOUNTER — Other Ambulatory Visit (HOSPITAL_BASED_OUTPATIENT_CLINIC_OR_DEPARTMENT_OTHER): Payer: Medicare Other | Admitting: Lab

## 2013-04-10 DIAGNOSIS — D649 Anemia, unspecified: Secondary | ICD-10-CM

## 2013-04-10 DIAGNOSIS — D696 Thrombocytopenia, unspecified: Secondary | ICD-10-CM

## 2013-04-10 LAB — COMPREHENSIVE METABOLIC PANEL (CC13)
ALT: 20 U/L (ref 0–55)
AST: 28 U/L (ref 5–34)
Alkaline Phosphatase: 63 U/L (ref 40–150)
BUN: 23.7 mg/dL (ref 7.0–26.0)
Chloride: 101 mEq/L (ref 98–109)
Creatinine: 1 mg/dL (ref 0.6–1.1)
Total Bilirubin: 0.53 mg/dL (ref 0.20–1.20)

## 2013-04-10 LAB — CBC WITH DIFFERENTIAL/PLATELET
BASO%: 0.7 % (ref 0.0–2.0)
Basophils Absolute: 0.1 10*3/uL (ref 0.0–0.1)
EOS%: 8.2 % — ABNORMAL HIGH (ref 0.0–7.0)
HCT: 35.8 % (ref 34.8–46.6)
HGB: 12.3 g/dL (ref 11.6–15.9)
MCH: 29.8 pg (ref 25.1–34.0)
MCHC: 34.5 g/dL (ref 31.5–36.0)
MCV: 86.5 fL (ref 79.5–101.0)
MONO%: 7.2 % (ref 0.0–14.0)
NEUT%: 63.9 % (ref 38.4–76.8)
lymph#: 1.8 10*3/uL (ref 0.9–3.3)

## 2013-04-10 LAB — IRON AND TIBC CHCC
%SAT: 28 % (ref 21–57)
TIBC: 353 ug/dL (ref 236–444)
UIBC: 253 ug/dL (ref 120–384)

## 2013-04-14 ENCOUNTER — Telehealth: Payer: Self-pay | Admitting: Internal Medicine

## 2013-04-14 NOTE — Telephone Encounter (Signed)
, °

## 2013-04-17 ENCOUNTER — Ambulatory Visit: Payer: Medicare Other | Admitting: Internal Medicine

## 2013-05-01 ENCOUNTER — Ambulatory Visit (HOSPITAL_BASED_OUTPATIENT_CLINIC_OR_DEPARTMENT_OTHER): Payer: Medicare Other | Admitting: Internal Medicine

## 2013-05-01 ENCOUNTER — Encounter: Payer: Self-pay | Admitting: Internal Medicine

## 2013-05-01 VITALS — BP 136/69 | HR 61 | Temp 97.6°F | Resp 17 | Ht 63.0 in | Wt 144.6 lb

## 2013-05-01 DIAGNOSIS — Z853 Personal history of malignant neoplasm of breast: Secondary | ICD-10-CM

## 2013-05-01 DIAGNOSIS — M810 Age-related osteoporosis without current pathological fracture: Secondary | ICD-10-CM

## 2013-05-01 NOTE — Progress Notes (Signed)
[facility] [phone]   [phone]  OFFICE PROGRESS NOTE  Romero Belling, MD [address] [place] [address]  DIAGNOSIS: Multifocal tubulolobular cancer status post mastectomy, ER positive, PR negative  PRIOR THERAPY:Arimidex on MA study since May 2005, completed five years of Arimidex.  CURRENT THERAPY: Observation  INTERVAL HISTORY: Autumn Galloway 79 y.o. female returns to the clinic today for followup visit. She is a former patient of Dr. Donnie Coffin is here today to establish care with me after he left the practice. The patient is feeling fine today with no specific complaints. She denied having any significant weight loss or night sweats. She denied having any chest pain, shortness breath, cough or hemoptysis. She has no nausea or vomiting. She has been observation for almost 10 years now and she was followed by Dr. Donnie Coffin on an annual basis. She had repeat CBC, comprehensive metabolic panel as well as iron study performed on 04/10/2013 and she is for evaluation and discussion of her lab results.  MEDICAL HISTORY: Past Medical History  Diagnosis Date  . THROMBOCYTOPENIA 12/25/2008  . RENAL INSUFFICIENCY 12/25/2008  . OSTEOPOROSIS 06/13/2010  . INTERNAL HEMORRHOIDS 09/09/2003  . HYPERTENSION 02/28/2007  . HYPERLIPIDEMIA 02/28/2007  . GERD 05/03/1998  . ESOPHAGEAL STRICTURE 05/03/1998  . DIZZINESS OR VERTIGO 02/28/2007  . DIVERTICULOSIS OF COLON 09/09/2003  . CAROTID ARTERY STENOSIS 06/13/2010  . BREAST CANCER, HX OF 02/28/2007  . Scoliosis   . Apical lung scarring     Chronic  . Vitamin D deficiency   . Hearing loss     ALLERGIES:  is allergic to lisinopril.  MEDICATIONS:  Current Outpatient Prescriptions  Medication Sig Dispense Refill  . aspirin 81 MG tablet Take 81 mg by mouth daily.        . calcium citrate-vitamin D 200-200 MG-UNIT TABS Take 1 tablet by mouth 2 (two) times daily.        . metoprolol succinate (TOPROL-XL) 25  MG 24 hr tablet Take 25 mg by mouth every morning. 1/2 tablet BID      . Multiple Vitamins-Minerals (CENTRUM SILVER PO) Take 1 tablet by mouth daily.        . simvastatin (ZOCOR) 80 MG tablet TAKE 1 TABLET BY MOUTH ONCE DAILY  90 tablet  1  . Vitamin D, Ergocalciferol, (DRISDOL) 50000 UNITS CAPS Take 50,000 Units by mouth every 30 (thirty) days.        Marland Kitchen FLUVIRIN INJ injection        No current facility-administered medications for this visit.    SURGICAL HISTORY:  Past Surgical History  Procedure Laterality Date  . Tubal ligation  1989  . Mastectomy  09/2003    left  . US echocardiography  05/03/2004  . Electrocardiogram  05/02/2005  . Carpal tunnel release      bilateral  . Tonsillectomy      REVIEW OF SYSTEMS:  Constitutional: negative Eyes: negative Ears, nose, mouth, throat, and face: negative Respiratory: negative Cardiovascular: negative Gastrointestinal: negative Genitourinary:negative Integument/breast: negative Hematologic/lymphatic: negative Musculoskeletal:negative Neurological: negative Behavioral/Psych: negative Endocrine: negative Allergic/Immunologic: negative   PHYSICAL EXAMINATION: General appearance: alert, cooperative and no distress Head: Normocephalic, without obvious abnormality, atraumatic Neck: no adenopathy, no JVD, supple, symmetrical, trachea midline and thyroid not enlarged, symmetric, no tenderness/mass/nodules Lymph nodes: Cervical, supraclavicular, and axillary nodes normal. Resp: clear to auscultation bilaterally Back: symmetric, no curvature. ROM normal. No CVA tenderness. Cardio: regular rate and rhythm, S1, S2 normal, no  murmur, click, rub or gallop GI: soft, non-tender; bowel sounds normal; no masses,  no organomegaly Extremities: extremities normal, atraumatic, no cyanosis or edema Neurologic: Alert and oriented X 3, normal strength and tone. Normal symmetric reflexes. Normal coordination and gait  ECOG PERFORMANCE STATUS: 1 -  Symptomatic but completely ambulatory  Blood pressure 136/69, pulse 61, temperature 97.6 F (36.4 C), temperature source Oral, resp. rate 17, height 5\' 3"  (1.6 m), weight 144 lb 9.6 oz (65.59 kg), SpO2 98.00%.  LABORATORY DATA: Lab Results  Component Value Date   WBC 8.8 04/10/2013   HGB 12.3 04/10/2013   HCT 35.8 04/10/2013   MCV 86.5 04/10/2013   PLT 255 04/10/2013      Chemistry      Component Value Date/Time   NA 140 04/10/2013 1040   NA 139 07/05/2012 0815   K 4.5 04/10/2013 1040   K 4.3 07/05/2012 0815   CL 102 07/05/2012 0815   CO2 32* 04/10/2013 1040   CO2 31 07/05/2012 0815   BUN 23.7 04/10/2013 1040   BUN 27* 07/05/2012 0815   CREATININE 1.0 04/10/2013 1040   CREATININE 0.92 07/05/2012 0815   CREATININE 1.11* 02/06/2012 0934      Component Value Date/Time   CALCIUM 9.3 04/10/2013 1040   CALCIUM 9.1 07/05/2012 0815   CALCIUM 9.1 07/05/2012 0815   ALKPHOS 63 04/10/2013 1040   ALKPHOS 59 07/05/2012 0815   AST 28 04/10/2013 1040   AST 28 07/05/2012 0815   ALT 20 04/10/2013 1040   ALT 19 07/05/2012 0815   BILITOT 0.53 04/10/2013 1040   BILITOT 0.4 07/05/2012 0815       RADIOGRAPHIC STUDIES: No results found.  ASSESSMENT AND PLAN: This is a very pleasant 79 years old white female with history of multifocal tubulolobular carcinoma status post mastectomy followed by treatment with Arimidex completed in 2010.  The patient is doing fine today with no specific complaints. I recommended for her to continue on observation with repeat CBC, comprehensive metabolic panel and CA 27.29 as well as a chest x-ray. For the iron deficiency anemia her iron study recently is unremarkable. The patient will continue on observation. She would come back for followup visit in one year for reevaluation. She was advised to call immediately if she has any concerning symptoms in the interval. The patient voices understanding of current disease status and treatment options and is in agreement with the  current care plan.  All questions were answered. The patient knows to call the clinic with any problems, questions or concerns. We can certainly see the patient much sooner if necessary.  I spent 15 minutes counseling the patient face to face. The total time spent in the appointment was 25 minutes.

## 2013-05-03 NOTE — Patient Instructions (Signed)
Followup visit in one year with repeat CBC, comprehensive metabolic panel and CA 27-29 as well as chest x-ray

## 2013-05-29 ENCOUNTER — Other Ambulatory Visit: Payer: Self-pay

## 2013-07-02 ENCOUNTER — Ambulatory Visit: Payer: Medicare Other | Admitting: Endocrinology

## 2013-07-07 ENCOUNTER — Ambulatory Visit (INDEPENDENT_AMBULATORY_CARE_PROVIDER_SITE_OTHER): Payer: Medicare Other | Admitting: Endocrinology

## 2013-07-07 ENCOUNTER — Encounter: Payer: Self-pay | Admitting: Endocrinology

## 2013-07-07 VITALS — BP 138/78 | HR 52 | Temp 98.0°F | Ht 63.5 in | Wt 147.0 lb

## 2013-07-07 DIAGNOSIS — E559 Vitamin D deficiency, unspecified: Secondary | ICD-10-CM | POA: Insufficient documentation

## 2013-07-07 DIAGNOSIS — M810 Age-related osteoporosis without current pathological fracture: Secondary | ICD-10-CM

## 2013-07-07 DIAGNOSIS — Z79899 Other long term (current) drug therapy: Secondary | ICD-10-CM

## 2013-07-07 DIAGNOSIS — E785 Hyperlipidemia, unspecified: Secondary | ICD-10-CM

## 2013-07-07 DIAGNOSIS — I10 Essential (primary) hypertension: Secondary | ICD-10-CM

## 2013-07-07 LAB — TSH: TSH: 1.78 u[IU]/mL (ref 0.35–5.50)

## 2013-07-07 LAB — LIPID PANEL: LDL Cholesterol: 60 mg/dL (ref 0–99)

## 2013-07-07 MED ORDER — LOSARTAN POTASSIUM 25 MG PO TABS: 25.0000 mg | ORAL_TABLET | Freq: Every day | ORAL | Status: DC

## 2013-07-07 NOTE — Patient Instructions (Addendum)
Please reduce metoprolol to 1/2 of 25 mg, twice a day. i have sent a prescription to your pharmacy, for an additional blood pressure.   Please come back for a regular physical appointment in 1 month.

## 2013-07-07 NOTE — Progress Notes (Signed)
Subjective:    Patient ID: Autumn Galloway, female    DOB: 09-Mar-1931, 79 y.o.   MRN: [id number]  HPI The state of at least three ongoing medical problems is addressed today, with interval history of each noted here: HTN: denies sob.   Bradycardia: toprol was reduced to 1/2 tab, 2 mos ago, due to dizziness.  However, she has re-increased, she she says she now takes 25 mg, bid.   Dyslipidemia: denies chest pain.   Past Medical History  Diagnosis Date  . THROMBOCYTOPENIA 12/25/2008  . RENAL INSUFFICIENCY 12/25/2008  . OSTEOPOROSIS 06/13/2010  . INTERNAL HEMORRHOIDS 09/09/2003  . HYPERTENSION 02/28/2007  . HYPERLIPIDEMIA 02/28/2007  . GERD 05/03/1998  . ESOPHAGEAL STRICTURE 05/03/1998  . DIZZINESS OR VERTIGO 02/28/2007  . DIVERTICULOSIS OF COLON 09/09/2003  . CAROTID ARTERY STENOSIS 06/13/2010  . BREAST CANCER, HX OF 02/28/2007  . Scoliosis   . Apical lung scarring     Chronic  . Vitamin D deficiency   . Hearing loss     Past Surgical History  Procedure Laterality Date  . Tubal ligation  1989  . Mastectomy  09/2003    left  . US echocardiography  05/03/2004  . Electrocardiogram  05/02/2005  . Carpal tunnel release      bilateral  . Tonsillectomy      History   Social History  . Marital Status: Widowed    Spouse Name: N/A    Number of Children: 4  . Years of Education: N/A   Occupational History  . Retired    Social History Main Topics  . Smoking status: Never Smoker   . Smokeless tobacco: Never Used  . Alcohol Use: No  . Drug Use: No  . Sexual Activity: Not on file   Other Topics Concern  . Not on file   Social History Narrative  . No narrative on file    Current Outpatient Prescriptions on File Prior to Visit  Medication Sig Dispense Refill  . aspirin 81 MG tablet Take 81 mg by mouth daily.        . Multiple Vitamins-Minerals (CENTRUM SILVER PO) Take 1 tablet by mouth daily.        . simvastatin (ZOCOR) 80 MG tablet TAKE 1 TABLET BY MOUTH ONCE DAILY  90 tablet  1    . Vitamin D, Ergocalciferol, (DRISDOL) 50000 UNITS CAPS Take 50,000 Units by mouth every 30 (thirty) days.         No current facility-administered medications on file prior to visit.    Allergies  Allergen Reactions  . Lisinopril     REACTION: Cough    Family History  Problem Relation Age of Onset  . Cancer Neg Hx   . Breast cancer Sister     x 2  . Breast cancer Maternal Aunt   . Breast cancer Other     nephew    BP 138/78  Pulse 52  Temp(Src) 98 F (36.7 C) (Oral)  Ht 5' 3.5" (1.613 m)  Wt 147 lb (66.679 kg)  BMI 25.63 kg/m2  SpO2 97%  Review of Systems Denies weight change and edema    Objective:   Physical Exam VITAL SIGNS:  See vs page GENERAL: no distress Ext: no edema  Lab Results  Component Value Date   CHOL 134 07/07/2013   HDL 56.40 07/07/2013   LDLCALC 60 07/07/2013   LDLDIRECT 127.8 06/21/2011   TRIG 89.0 07/07/2013   CHOLHDL 2 07/07/2013  Assessment & Plan:  Bradycardia: due to or exac by b-blocker HTN: she will need increased rx, when the b-blocker is reduced. Dyslipidemia: she needs increased rx

## 2013-07-08 LAB — PTH, INTACT AND CALCIUM: PTH: 37.6 pg/mL (ref 14.0–72.0)

## 2013-07-08 LAB — VITAMIN D 25 HYDROXY (VIT D DEFICIENCY, FRACTURES): Vit D, 25-Hydroxy: 61 ng/mL (ref 30–89)

## 2013-08-06 ENCOUNTER — Encounter: Payer: Self-pay | Admitting: Endocrinology

## 2013-08-06 ENCOUNTER — Ambulatory Visit (INDEPENDENT_AMBULATORY_CARE_PROVIDER_SITE_OTHER): Payer: Managed Care, Other (non HMO) | Admitting: Endocrinology

## 2013-08-06 VITALS — BP 116/68 | HR 69 | Temp 97.9°F | Ht 63.5 in | Wt 149.0 lb

## 2013-08-06 DIAGNOSIS — E785 Hyperlipidemia, unspecified: Secondary | ICD-10-CM

## 2013-08-06 DIAGNOSIS — I6529 Occlusion and stenosis of unspecified carotid artery: Secondary | ICD-10-CM

## 2013-08-06 DIAGNOSIS — Z0000 Encounter for general adult medical examination without abnormal findings: Secondary | ICD-10-CM

## 2013-08-06 DIAGNOSIS — I10 Essential (primary) hypertension: Secondary | ICD-10-CM

## 2013-08-06 LAB — TSH: TSH: 1.6 u[IU]/mL (ref 0.35–5.50)

## 2013-08-06 LAB — LIPID PANEL
CHOL/HDL RATIO: 2
Cholesterol: 131 mg/dL (ref 0–200)
HDL: 65.4 mg/dL (ref >39.00)
LDL CALC: 44 mg/dL (ref 0–99)
Triglycerides: 106 mg/dL (ref 0.0–149.0)
VLDL: 21.2 mg/dL (ref 0.0–40.0)

## 2013-08-06 NOTE — Patient Instructions (Addendum)
please consider these measures for your health:  minimize alcohol.  do not use tobacco products.  have a colonoscopy at least every 10 years from age 78.  Women should have an annual mammogram from age 28.  keep firearms safely stored.  always use seat belts.  have working smoke alarms in your home.  see an eye doctor and dentist regularly.  never drive under the influence of alcohol or drugs (including prescription drugs).  those with fair skin should take precautions against the sun.   blood tests are being requested for you today.  We'll contact you with results.   Please return in 1 year.   Let's recheck the carotid arteries.  you will receive a phone call, about a day and time for an appointment

## 2013-08-06 NOTE — Progress Notes (Signed)
Subjective:    Patient ID: Autumn Galloway, female    DOB: 06/09/1931, 80 y.o.   MRN: [id number]  HPI Pt is here for regular wellness examination, and is feeling pretty well in general, and says chronic med probs are stable, except as noted below Past Medical History  Diagnosis Date  . THROMBOCYTOPENIA 12/25/2008  . RENAL INSUFFICIENCY 12/25/2008  . OSTEOPOROSIS 06/13/2010  . INTERNAL HEMORRHOIDS 09/09/2003  . HYPERTENSION 02/28/2007  . HYPERLIPIDEMIA 02/28/2007  . GERD 05/03/1998  . ESOPHAGEAL STRICTURE 05/03/1998  . DIZZINESS OR VERTIGO 02/28/2007  . DIVERTICULOSIS OF COLON 09/09/2003  . CAROTID ARTERY STENOSIS 06/13/2010  . BREAST CANCER, HX OF 02/28/2007  . Scoliosis   . Apical lung scarring     Chronic  . Vitamin D deficiency   . Hearing loss     Past Surgical History  Procedure Laterality Date  . Tubal ligation  1989  . Mastectomy  09/2003    left  . US echocardiography  05/03/2004  . Electrocardiogram  05/02/2005  . Carpal tunnel release      bilateral  . Tonsillectomy      History   Social History  . Marital Status: Widowed    Spouse Name: N/A    Number of Children: 4  . Years of Education: N/A   Occupational History  . Retired    Social History Main Topics  . Smoking status: Never Smoker   . Smokeless tobacco: Never Used  . Alcohol Use: No  . Drug Use: No  . Sexual Activity: Not on file   Other Topics Concern  . Not on file   Social History Narrative  . No narrative on file    Current Outpatient Prescriptions on File Prior to Visit  Medication Sig Dispense Refill  . aspirin 81 MG tablet Take 81 mg by mouth daily.        Marland Kitchen losartan (COZAAR) 25 MG tablet Take 1 tablet (25 mg total) by mouth daily.  30 tablet  11  . metoprolol tartrate (LOPRESSOR) 25 MG tablet Take 12.5 mg by mouth 2 (two) times daily.      . Multiple Vitamins-Minerals (CENTRUM SILVER PO) Take 1 tablet by mouth daily.        . simvastatin (ZOCOR) 80 MG tablet TAKE 1 TABLET BY MOUTH ONCE  DAILY  90 tablet  1  . Vitamin D, Ergocalciferol, (DRISDOL) 50000 UNITS CAPS Take 50,000 Units by mouth every 30 (thirty) days.         No current facility-administered medications on file prior to visit.    Allergies  Allergen Reactions  . Lisinopril     REACTION: Cough    Family History  Problem Relation Age of Onset  . Cancer Neg Hx   . Breast cancer Sister     x 2  . Breast cancer Maternal Aunt   . Breast cancer Other     nephew    BP 116/68  Pulse 69  Temp(Src) 97.9 F (36.6 C) (Oral)  Ht 5' 3.5" (1.613 m)  Wt 149 lb (67.586 kg)  BMI 25.98 kg/m2  SpO2 95%     Review of Systems  Constitutional: Negative for fever and unexpected weight change.  HENT:       No change in chronic hearing loss  Eyes: Negative for visual disturbance.  Respiratory: Negative for shortness of breath.   Cardiovascular: Negative for chest pain.  Gastrointestinal: Negative for anal bleeding.  Endocrine: Negative for cold intolerance.  Genitourinary: Negative for  hematuria and vaginal bleeding.  Musculoskeletal: Negative for back pain.  Skin: Negative for rash.  Allergic/Immunologic: Negative for environmental allergies.  Neurological: Negative for syncope and numbness.  Hematological: Does not bruise/bleed easily.  Psychiatric/Behavioral: Negative for dysphoric mood.      Objective:   Physical Exam VS: see vs page GEN: no distress HEAD: head: no deformity eyes: no periorbital swelling, no proptosis external nose and ears are normal mouth: no lesion seen Both eac's and tm's are normal.  bilat hering aids. NECK: supple, thyroid is not enlarged CHEST WALL: no deformity LUNGS:  Clear to auscultation LEFT BREAST:  No mass.  No d/c (left mastectomy) CV: reg rate and rhythm, no murmur ABD: abdomen is soft, nontender.  no hepatosplenomegaly.  not distended.  no hernia GENITALIA:  Normal external female.  Normal bimanual exam RECTAL: normal external and internal exam.  heme  neg MUSCULOSKELETAL: muscle bulk and strength are grossly normal.  no obvious joint swelling.  gait is normal and steady EXTEMITIES: no deformity.  no ulcer on the feet.  feet are of normal color and temp.  no edema PULSES: dorsalis pedis intact bilat.  no carotid bruit NEURO:  cn 2-12 grossly intact.   readily moves all 4's.  sensation is intact to touch on the feet SKIN:  Normal texture and temperature.  No rash or suspicious lesion is visible.   NODES:  None palpable at the neck PSYCH: alert, well-oriented.  Does not appear anxious nor depressed.     i reviewed electrocardiogram.    Assessment & Plan:  Wellness visit today, with problems stable, except as noted. we discussed code status.  pt requests DNR.

## 2013-08-07 DIAGNOSIS — Z0000 Encounter for general adult medical examination without abnormal findings: Secondary | ICD-10-CM | POA: Insufficient documentation

## 2013-08-12 ENCOUNTER — Ambulatory Visit: Payer: Medicare Other | Admitting: Endocrinology

## 2013-11-18 ENCOUNTER — Other Ambulatory Visit: Payer: Self-pay

## 2013-11-18 ENCOUNTER — Telehealth: Payer: Self-pay | Admitting: Endocrinology

## 2013-11-18 ENCOUNTER — Ambulatory Visit (INDEPENDENT_AMBULATORY_CARE_PROVIDER_SITE_OTHER): Payer: Managed Care, Other (non HMO) | Admitting: Endocrinology

## 2013-11-18 ENCOUNTER — Encounter: Payer: Self-pay | Admitting: Endocrinology

## 2013-11-18 VITALS — BP 108/60 | HR 70 | Temp 98.5°F | Ht 63.5 in | Wt 150.0 lb

## 2013-11-18 DIAGNOSIS — H9210 Otorrhea, unspecified ear: Secondary | ICD-10-CM

## 2013-11-18 DIAGNOSIS — H9220 Otorrhagia, unspecified ear: Secondary | ICD-10-CM

## 2013-11-18 MED ORDER — NEOMYCIN-POLYMYXIN-HC 1 % OT SOLN: 3.0000 [drp] | OTIC | Status: DC

## 2013-11-18 NOTE — Telephone Encounter (Signed)
See below and please advise. [facility]. Thanks!

## 2013-11-18 NOTE — Telephone Encounter (Signed)
i would need to see this in order to know where to send you.  Please advise ov today

## 2013-11-18 NOTE — Telephone Encounter (Signed)
Pt would like to see who Dr. Loanne Drilling would refer for an ear issue. Her ear is bleeding at this time and needs to see an ear MD today.

## 2013-11-18 NOTE — Telephone Encounter (Signed)
Pt scheduled for appointment at 2:15 coming in earlier.

## 2013-11-18 NOTE — Telephone Encounter (Signed)
Rx sent 

## 2013-11-18 NOTE — Patient Instructions (Addendum)
i have sent a prescription to your pharmacy, for antibiotic drops. I hope you feel better soon.  If you don't feel better by next week, please call back.  Please call sooner if you get worse.

## 2013-11-18 NOTE — Progress Notes (Signed)
   Subjective:    Patient ID: Autumn Galloway, female    DOB: August 29, 1930, 80 y.o.   MRN: [id number]  HPI Pt states few days of moderate decreased hearing at the left ear.  Today, after cleaning with a q-tip, she developed assoc bleeding.   Past Medical History  Diagnosis Date  . THROMBOCYTOPENIA 12/25/2008  . RENAL INSUFFICIENCY 12/25/2008  . OSTEOPOROSIS 06/13/2010  . INTERNAL HEMORRHOIDS 09/09/2003  . HYPERTENSION 02/28/2007  . HYPERLIPIDEMIA 02/28/2007  . GERD 05/03/1998  . ESOPHAGEAL STRICTURE 05/03/1998  . DIZZINESS OR VERTIGO 02/28/2007  . DIVERTICULOSIS OF COLON 09/09/2003  . CAROTID ARTERY STENOSIS 06/13/2010  . BREAST CANCER, HX OF 02/28/2007  . Scoliosis   . Apical lung scarring     Chronic  . Vitamin D deficiency   . Hearing loss     Past Surgical History  Procedure Laterality Date  . Tubal ligation  1989  . Mastectomy  09/2003    left  . US echocardiography  05/03/2004  . Electrocardiogram  05/02/2005  . Carpal tunnel release      bilateral  . Tonsillectomy      History   Social History  . Marital Status: Widowed    Spouse Name: N/A    Number of Children: 4  . Years of Education: N/A   Occupational History  . Retired    Social History Main Topics  . Smoking status: Never Smoker   . Smokeless tobacco: Never Used  . Alcohol Use: No  . Drug Use: No  . Sexual Activity: Not on file   Other Topics Concern  . Not on file   Social History Narrative  . No narrative on file    Current Outpatient Prescriptions on File Prior to Visit  Medication Sig Dispense Refill  . aspirin 81 MG tablet Take 81 mg by mouth daily.        Marland Kitchen losartan (COZAAR) 25 MG tablet Take 1 tablet (25 mg total) by mouth daily.  30 tablet  11  . metoprolol tartrate (LOPRESSOR) 25 MG tablet Take 12.5 mg by mouth 2 (two) times daily.      . Multiple Vitamins-Minerals (CENTRUM SILVER PO) Take 1 tablet by mouth daily.        . simvastatin (ZOCOR) 80 MG tablet TAKE 1 TABLET BY MOUTH ONCE DAILY  90  tablet  1  . Vitamin D, Ergocalciferol, (DRISDOL) 50000 UNITS CAPS Take 50,000 Units by mouth every 30 (thirty) days.         No current facility-administered medications on file prior to visit.    Allergies  Allergen Reactions  . Lisinopril     REACTION: Cough    Family History  Problem Relation Age of Onset  . Cancer Neg Hx   . Breast cancer Sister     x 2  . Breast cancer Maternal Aunt   . Breast cancer Other     nephew    BP 108/60  Pulse 70  Temp(Src) 98.5 F (36.9 C) (Oral)  Ht 5' 3.5" (1.613 m)  Wt 150 lb (68.04 kg)  BMI 26.15 kg/m2  SpO2 91%  Review of Systems Denies fever.  Pain is resolved.      Objective:   Physical Exam VITAL SIGNS:  See vs page.   GENERAL: no distress. Left EAC: moderate amount of liquid and dried blood, but no active bleeding.       Assessment & Plan:  EAC bleeding, better.

## 2014-02-27 ENCOUNTER — Encounter: Payer: Self-pay | Admitting: Endocrinology

## 2014-03-17 ENCOUNTER — Encounter: Payer: Self-pay | Admitting: Endocrinology

## 2014-03-17 ENCOUNTER — Telehealth: Payer: Self-pay | Admitting: Endocrinology

## 2014-03-17 ENCOUNTER — Ambulatory Visit (INDEPENDENT_AMBULATORY_CARE_PROVIDER_SITE_OTHER): Payer: Managed Care, Other (non HMO) | Admitting: Endocrinology

## 2014-03-17 ENCOUNTER — Ambulatory Visit
Admission: RE | Admit: 2014-03-17 | Discharge: 2014-03-17 | Disposition: A | Discharge status: Home / Self Care | Payer: Medicare HMO | Source: Ambulatory Visit | Attending: Endocrinology | Admitting: Endocrinology

## 2014-03-17 VITALS — BP 130/70 | HR 87 | Temp 98.3°F | Ht 63.5 in | Wt 145.0 lb

## 2014-03-17 DIAGNOSIS — R05 Cough: Secondary | ICD-10-CM

## 2014-03-17 DIAGNOSIS — R059 Cough, unspecified: Secondary | ICD-10-CM

## 2014-03-17 MED ORDER — FLUTICASONE-SALMETEROL 100-50 MCG/DOSE IN AEPB: 1.0000 | INHALATION_SPRAY | Freq: Two times a day (BID) | RESPIRATORY_TRACT | Status: DC

## 2014-03-17 MED ORDER — BENZONATATE 100 MG PO CAPS: 100.0000 mg | ORAL_CAPSULE | Freq: Three times a day (TID) | ORAL | Status: DC | PRN

## 2014-03-17 MED ORDER — CEFUROXIME AXETIL 250 MG PO TABS: 250.0000 mg | ORAL_TABLET | Freq: Two times a day (BID) | ORAL | Status: AC

## 2014-03-17 NOTE — Telephone Encounter (Signed)
Pt wanted to com to office today. Added to schedule at 1 pm.

## 2014-03-17 NOTE — Telephone Encounter (Signed)
See below and please advise, Thanks!  

## 2014-03-17 NOTE — Telephone Encounter (Signed)
If no appointment available, add-on at 1 PM

## 2014-03-17 NOTE — Progress Notes (Signed)
Subjective:    Patient ID: Autumn Galloway, female    DOB: [birth year], 80 y.o.   MRN: [id number]  HPI Pt states 2 days of slight wheezing in the chest, and assoc cough.   Other than this illness, she has only a little wheezing in general. Past Medical History  Diagnosis Date  . THROMBOCYTOPENIA 12/25/2008  . RENAL INSUFFICIENCY 12/25/2008  . OSTEOPOROSIS 06/13/2010  . INTERNAL HEMORRHOIDS 09/09/2003  . HYPERTENSION 02/28/2007  . HYPERLIPIDEMIA 02/28/2007  . GERD 05/03/1998  . ESOPHAGEAL STRICTURE 05/03/1998  . DIZZINESS OR VERTIGO 02/28/2007  . DIVERTICULOSIS OF COLON 09/09/2003  . CAROTID ARTERY STENOSIS 06/13/2010  . BREAST CANCER, HX OF 02/28/2007  . Scoliosis   . Apical lung scarring     Chronic  . Vitamin D deficiency   . Hearing loss     Past Surgical History  Procedure Laterality Date  . Tubal ligation  1989  . Mastectomy  09/2003    left  . US echocardiography  05/03/2004  . Electrocardiogram  05/02/2005  . Carpal tunnel release      bilateral  . Tonsillectomy      History   Social History  . Marital Status: Widowed    Spouse Name: N/A    Number of Children: 4  . Years of Education: N/A   Occupational History  . Retired    Social History Main Topics  . Smoking status: Never Smoker   . Smokeless tobacco: Never Used  . Alcohol Use: No  . Drug Use: No  . Sexual Activity: Not on file   Other Topics Concern  . Not on file   Social History Narrative  . No narrative on file    Current Outpatient Prescriptions on File Prior to Visit  Medication Sig Dispense Refill  . aspirin 81 MG tablet Take 81 mg by mouth daily.        Marland Kitchen losartan (COZAAR) 25 MG tablet Take 1 tablet (25 mg total) by mouth daily.  30 tablet  11  . metoprolol tartrate (LOPRESSOR) 25 MG tablet Take 12.5 mg by mouth 2 (two) times daily.      . Multiple Vitamins-Minerals (CENTRUM SILVER PO) Take 1 tablet by mouth daily.        . NEOMYCIN-POLYMYXIN-HYDROCORTISONE (CORTISPORIN) 1 % SOLN otic solution  Place 3 drops into the left ear every 4 (four) hours.  20 mL  0  . simvastatin (ZOCOR) 80 MG tablet TAKE 1 TABLET BY MOUTH ONCE DAILY  90 tablet  1  . Vitamin D, Ergocalciferol, (DRISDOL) 50000 UNITS CAPS Take 50,000 Units by mouth every 30 (thirty) days.         No current facility-administered medications on file prior to visit.    Allergies  Allergen Reactions  . Lisinopril     REACTION: Cough    Family History  Problem Relation Age of Onset  . Cancer Neg Hx   . Breast cancer Sister     x 2  . Breast cancer Maternal Aunt   . Breast cancer Other     nephew    BP 130/70  Pulse 87  Temp(Src) 98.3 F (36.8 C) (Oral)  Ht 5' 3.5" (1.613 m)  Wt 145 lb (65.772 kg)  BMI 25.28 kg/m2  SpO2 90%   Review of Systems Denies fever and earache    Objective:   Physical Exam VITAL SIGNS:  See vs page GENERAL: no distress head: no deformity eyes: no periorbital swelling, no proptosis external nose and ears  are normal mouth: no lesion seen Both eac's and tm's are normal LUNGS:  Clear to auscultation   CXR : NAD    Assessment & Plan:  Acute bronchitis, new HTN: we may need to reduce the metoprolol.  Patient is advised the following: Patient Instructions  i have sent a prescriptions to your pharmacy: inhaler, antibiotic pill, and cough medication.   I hope you feel better soon.  If you don't feel better by next week, please call back.  Please call sooner if you get worse.   Please do a chest-x-ray, on the first floor, today.  If the wheezing keeps up, we my need to change the metoprolol.

## 2014-03-17 NOTE — Telephone Encounter (Signed)
Patient complains today of a cough, wheezing and dizziness x2 days  She would like to see Dr. Loanne Drilling   Please advise patient   Call [phone]

## 2014-03-17 NOTE — Patient Instructions (Addendum)
i have sent a prescriptions to your pharmacy: inhaler, antibiotic pill, and cough medication.   I hope you feel better soon.  If you don't feel better by next week, please call back.  Please call sooner if you get worse.   Please do a chest-x-ray, on the first floor, today.  If the wheezing keeps up, we my need to change the metoprolol.

## 2014-04-20 ENCOUNTER — Other Ambulatory Visit: Payer: Self-pay

## 2014-04-20 MED ORDER — LOSARTAN POTASSIUM 25 MG PO TABS: 25.0000 mg | ORAL_TABLET | Freq: Every day | ORAL | Status: DC

## 2014-04-28 ENCOUNTER — Other Ambulatory Visit (HOSPITAL_COMMUNITY): Payer: Medicare Other

## 2014-04-28 ENCOUNTER — Other Ambulatory Visit (HOSPITAL_COMMUNITY): Payer: Self-pay | Admitting: Endocrinology

## 2014-04-28 ENCOUNTER — Ambulatory Visit (HOSPITAL_COMMUNITY)
Admission: RE | Admit: 2014-04-28 | Discharge: 2014-04-28 | Disposition: A | Discharge status: Home / Self Care | Payer: Medicare HMO | Source: Ambulatory Visit | Attending: Endocrinology | Admitting: Endocrinology

## 2014-04-28 DIAGNOSIS — I63239 Cerebral infarction due to unspecified occlusion or stenosis of unspecified carotid arteries: Secondary | ICD-10-CM

## 2014-04-28 DIAGNOSIS — I6529 Occlusion and stenosis of unspecified carotid artery: Secondary | ICD-10-CM | POA: Insufficient documentation

## 2014-04-28 DIAGNOSIS — I6523 Occlusion and stenosis of bilateral carotid arteries: Secondary | ICD-10-CM

## 2014-04-28 NOTE — Progress Notes (Signed)
Bilateral carotid artery duplex completed:  1-39% ICA stenosis.  Vertebral artery flow is antegrade.     

## 2014-07-27 ENCOUNTER — Encounter: Payer: Self-pay | Admitting: Endocrinology

## 2014-07-27 ENCOUNTER — Ambulatory Visit (INDEPENDENT_AMBULATORY_CARE_PROVIDER_SITE_OTHER): Payer: Medicare HMO | Admitting: Endocrinology

## 2014-07-27 VITALS — BP 138/88 | HR 67 | Temp 97.9°F | Ht 63.5 in | Wt 151.0 lb

## 2014-07-27 DIAGNOSIS — Z0000 Encounter for general adult medical examination without abnormal findings: Secondary | ICD-10-CM

## 2014-07-27 DIAGNOSIS — Z23 Encounter for immunization: Secondary | ICD-10-CM

## 2014-07-27 DIAGNOSIS — E785 Hyperlipidemia, unspecified: Secondary | ICD-10-CM

## 2014-07-27 DIAGNOSIS — M810 Age-related osteoporosis without current pathological fracture: Secondary | ICD-10-CM

## 2014-07-27 DIAGNOSIS — E559 Vitamin D deficiency, unspecified: Secondary | ICD-10-CM

## 2014-07-27 DIAGNOSIS — N259 Disorder resulting from impaired renal tubular function, unspecified: Secondary | ICD-10-CM

## 2014-07-27 DIAGNOSIS — I10 Essential (primary) hypertension: Secondary | ICD-10-CM

## 2014-07-27 DIAGNOSIS — D696 Thrombocytopenia, unspecified: Secondary | ICD-10-CM

## 2014-07-27 DIAGNOSIS — K648 Other hemorrhoids: Secondary | ICD-10-CM

## 2014-07-27 NOTE — Progress Notes (Signed)
Subjective:    Patient ID: Autumn Galloway, female    DOB: 1930/11/05, 81 y.o.   MRN: [id number]  HPI Pt states 1 day of moderate pain at the right heel, but no assoc rash.  No injury.  Pain is much less today.  She is unable to cite precip factor.   Past Medical History  Diagnosis Date  . THROMBOCYTOPENIA 12/25/2008  . RENAL INSUFFICIENCY 12/25/2008  . OSTEOPOROSIS 06/13/2010  . INTERNAL HEMORRHOIDS 09/09/2003  . HYPERTENSION 02/28/2007  . HYPERLIPIDEMIA 02/28/2007  . GERD 05/03/1998  . ESOPHAGEAL STRICTURE 05/03/1998  . DIZZINESS OR VERTIGO 02/28/2007  . DIVERTICULOSIS OF COLON 09/09/2003  . CAROTID ARTERY STENOSIS 06/13/2010  . BREAST CANCER, HX OF 02/28/2007  . Scoliosis   . Apical lung scarring     Chronic  . Vitamin D deficiency   . Hearing loss     Past Surgical History  Procedure Laterality Date  . Tubal ligation  1989  . Mastectomy  09/2003    left  . US echocardiography  05/03/2004  . Electrocardiogram  05/02/2005  . Carpal tunnel release      bilateral  . Tonsillectomy      History   Social History  . Marital Status: Widowed    Spouse Name: N/A    Number of Children: 4  . Years of Education: N/A   Occupational History  . Retired    Social History Main Topics  . Smoking status: Never Smoker   . Smokeless tobacco: Never Used  . Alcohol Use: No  . Drug Use: No  . Sexual Activity: Not on file   Other Topics Concern  . Not on file   Social History Narrative    Current Outpatient Prescriptions on File Prior to Visit  Medication Sig Dispense Refill  . aspirin 81 MG tablet Take 81 mg by mouth daily.      Marland Kitchen losartan (COZAAR) 25 MG tablet Take 1 tablet (25 mg total) by mouth daily. 90 tablet 3  . metoprolol tartrate (LOPRESSOR) 25 MG tablet Take 12.5 mg by mouth 2 (two) times daily.    . Multiple Vitamins-Minerals (CENTRUM SILVER PO) Take 1 tablet by mouth daily.      . simvastatin (ZOCOR) 80 MG tablet TAKE 1 TABLET BY MOUTH ONCE DAILY 90 tablet 1  . Vitamin D,  Ergocalciferol, (DRISDOL) 50000 UNITS CAPS Take 50,000 Units by mouth every 30 (thirty) days.       No current facility-administered medications on file prior to visit.    Allergies  Allergen Reactions  . Lisinopril     REACTION: Cough    Family History  Problem Relation Age of Onset  . Cancer Neg Hx   . Breast cancer Sister     x 2  . Breast cancer Maternal Aunt   . Breast cancer Other     nephew    BP 138/88 mmHg  Pulse 67  Temp(Src) 97.9 F (36.6 C) (Oral)  Ht 5' 3.5" (1.613 m)  Wt 151 lb (68.493 kg)  BMI 26.33 kg/m2  SpO2 96%    Review of Systems Denies fever and numbness.      Objective:   Physical Exam VITAL SIGNS:  See vs page GENERAL: no distress Pulses: right dorsalis pedis is intact.   MSK: no deformity of the right foot.  It is nontender.   CV: no right leg edema Skin:  no ulcer on the right foot.  normal color and temp on the right foot. Neuro: sensation  is intact to touch on the right foot.         Assessment & Plan:  Heel pain, new.  As pt says sxs are improved, she declines x-ray or rx for now. Osteoporosis: recheck dexa HTN: well-controlled    Subjective:   Patient here for Medicare annual wellness visit and management of other chronic and acute problems.     Risk factors: advanced age.    Roster of Physicians Providing Medical Care to Patient:  See "snapshot"   Activities of Daily Living: In your present state of health, do you have any difficulty performing the following activities (lives alone)?:  Preparing food and eating?: No  Bathing yourself: No  Getting dressed: No  Using the toilet: No  Moving around from place to place: No  In the past year have you fallen or had a near fall?: No    Home Safety: Has smoke detector and wears seat belts. Firearms are safely stored. No excess sun exposure.  Diet and Exercise  Current exercise habits: pt says good, as limits by health probs. Dietary issues discussed: pt reports a  healthy diet   Depression Screen  Q1: Over the past two weeks, have you felt down, depressed or hopeless? no  Q2: Over the past two weeks, have you felt little interest or pleasure in doing things? no   The following portions of the patient's history were reviewed and updated as appropriate: allergies, current medications, past family history, past medical history, past social history, past surgical history and problem list.   Review of Systems  No renent change in chronic hearing loss; denies visual loss Objective:   Vision:  Sees opthalmologist Hearing: grossly normal Body mass index:  See vs page Msk: pt slowly performs "get-up-and-go" from a sitting position Cognitive Impairment Assessment: cognition, memory and judgment appear normal.  remembers 3/3 at 5 minutes.  excellent recall.  can easily read and write a sentence.  alert and oriented x 3.     Assessment:   Medicare wellness utd on preventive parameters    Plan:   During the course of the visit the patient was educated and counseled about appropriate screening and preventive services including:        Fall prevention   Screening mammography  Bone densitometry screening  Diabetes screening  Nutrition counseling   Vaccines / LABS Zostavax / Pneumococcal Vaccine  today   Patient Instructions (the written plan) was given to the patient.

## 2014-07-27 NOTE — Patient Instructions (Addendum)
I hope you feel better soon.  If you don't feel better by next week, please call back.   blood tests are being requested for you today.  We'll let you know about the results.   please consider these measures for your health:  minimize alcohol.  do not use tobacco products.  have a colonoscopy at least every 10 years from age 79.  Women should have an annual mammogram from age 73.  keep firearms safely stored.  always use seat belts.  have working smoke alarms in your home.  see an eye doctor and dentist regularly.  never drive under the influence of alcohol or drugs (including prescription drugs).  those with fair skin should take precautions against the sun. please let me know what your wishes would be, if artificial life support measures should become necessary.  it is critically important to prevent falling down (keep floor areas well-lit, dry, and free of loose objects.  If you have a cane, walker, or wheelchair, you should use it, even for short trips around the house.  Also, try not to rush). Please come back any day after 08/06/14, for a regular physical.

## 2014-07-30 ENCOUNTER — Other Ambulatory Visit: Payer: Self-pay

## 2014-07-30 ENCOUNTER — Telehealth: Payer: Self-pay | Admitting: Endocrinology

## 2014-07-30 MED ORDER — LOSARTAN POTASSIUM 25 MG PO TABS: 25.0000 mg | ORAL_TABLET | Freq: Every day | ORAL | Status: DC

## 2014-08-03 NOTE — Telephone Encounter (Signed)
Error

## 2014-08-14 ENCOUNTER — Inpatient Hospital Stay: Admission: RE | Admit: 2014-08-14 | Payer: Medicare HMO | Source: Ambulatory Visit

## 2014-08-14 ENCOUNTER — Encounter: Payer: Medicare HMO | Admitting: Endocrinology

## 2014-08-19 ENCOUNTER — Ambulatory Visit (INDEPENDENT_AMBULATORY_CARE_PROVIDER_SITE_OTHER)
Admission: RE | Admit: 2014-08-19 | Discharge: 2014-08-19 | Disposition: A | Discharge status: Home / Self Care | Payer: Medicare HMO | Source: Ambulatory Visit | Attending: Endocrinology | Admitting: Endocrinology

## 2014-08-19 ENCOUNTER — Ambulatory Visit (INDEPENDENT_AMBULATORY_CARE_PROVIDER_SITE_OTHER): Payer: Medicare HMO | Admitting: Endocrinology

## 2014-08-19 ENCOUNTER — Encounter: Payer: Self-pay | Admitting: Endocrinology

## 2014-08-19 ENCOUNTER — Ambulatory Visit
Admission: RE | Admit: 2014-08-19 | Discharge: 2014-08-19 | Disposition: A | Discharge status: Home / Self Care | Payer: Medicare HMO | Source: Ambulatory Visit | Attending: Endocrinology | Admitting: Endocrinology

## 2014-08-19 VITALS — BP 122/84 | HR 74 | Temp 98.2°F | Ht 63.5 in | Wt 151.0 lb

## 2014-08-19 DIAGNOSIS — M79671 Pain in right foot: Secondary | ICD-10-CM

## 2014-08-19 DIAGNOSIS — I10 Essential (primary) hypertension: Secondary | ICD-10-CM

## 2014-08-19 DIAGNOSIS — M810 Age-related osteoporosis without current pathological fracture: Secondary | ICD-10-CM

## 2014-08-19 DIAGNOSIS — E559 Vitamin D deficiency, unspecified: Secondary | ICD-10-CM

## 2014-08-19 DIAGNOSIS — Z0000 Encounter for general adult medical examination without abnormal findings: Secondary | ICD-10-CM

## 2014-08-19 LAB — LIPID PANEL
CHOLESTEROL: 134 mg/dL (ref 0–200)
HDL: 59.3 mg/dL (ref >39.00)
LDL CALC: 45 mg/dL (ref 0–99)
NonHDL: 74.7
TRIGLYCERIDES: 150 mg/dL — AB (ref 0.0–149.0)
Total CHOL/HDL Ratio: 2
VLDL: 30 mg/dL (ref 0.0–40.0)

## 2014-08-19 LAB — URINALYSIS, ROUTINE W REFLEX MICROSCOPIC
BILIRUBIN URINE: NEGATIVE
HGB URINE DIPSTICK: NEGATIVE
KETONES UR: NEGATIVE
Leukocytes, UA: NEGATIVE
NITRITE: NEGATIVE
RBC / HPF: NONE SEEN (ref 0–?)
Specific Gravity, Urine: 1.025 (ref 1.000–1.030)
Total Protein, Urine: NEGATIVE
URINE GLUCOSE: NEGATIVE
Urobilinogen, UA: 0.2 (ref 0.0–1.0)
pH: 6 (ref 5.0–8.0)

## 2014-08-19 LAB — VITAMIN D 25 HYDROXY (VIT D DEFICIENCY, FRACTURES): VITD: 55.07 ng/mL (ref 30.00–100.00)

## 2014-08-19 LAB — TSH: TSH: 2.34 u[IU]/mL (ref 0.35–4.50)

## 2014-08-19 NOTE — Progress Notes (Signed)
we discussed code status.  pt requests DNR 

## 2014-08-19 NOTE — Patient Instructions (Addendum)
blood tests and x-rays are being requested for you today.  We'll let you know about the results.   Based on the results, we may be able to reduce the simvastatin.   please consider these measures for your health:  minimize alcohol.  do not use tobacco products.  have a colonoscopy at least every 10 years from age 79.  Women should have an annual mammogram from age 89.  keep firearms safely stored.  always use seat belts.  have working smoke alarms in your home.  see an eye doctor and dentist regularly.  never drive under the influence of alcohol or drugs (including prescription drugs).  those with fair skin should take precautions against the sun. it is critically important to prevent falling down (keep floor areas well-lit, dry, and free of loose objects.  If you have a cane, walker, or wheelchair, you should use it, even for short trips around the house.  Also, try not to rush). Please come back for a follow-up appointment in 6 months.

## 2014-08-19 NOTE — Progress Notes (Signed)
Subjective:    Patient ID: Autumn Galloway, female    DOB: [birth year], 81 y.o.   MRN: [id number]  HPI  Pt is here for regular wellness examination, and is feeling pretty well in general, and says chronic med probs are stable, except as noted below.  She declines ecg, colonoscopy, and zostavax.  Past Medical History  Diagnosis Date  . THROMBOCYTOPENIA 12/25/2008  . RENAL INSUFFICIENCY 12/25/2008  . OSTEOPOROSIS 06/13/2010  . INTERNAL HEMORRHOIDS 09/09/2003  . HYPERTENSION 02/28/2007  . HYPERLIPIDEMIA 02/28/2007  . GERD 05/03/1998  . ESOPHAGEAL STRICTURE 05/03/1998  . DIZZINESS OR VERTIGO 02/28/2007  . DIVERTICULOSIS OF COLON 09/09/2003  . CAROTID ARTERY STENOSIS 06/13/2010  . BREAST CANCER, HX OF 02/28/2007  . Scoliosis   . Apical lung scarring     Chronic  . Vitamin D deficiency   . Hearing loss     Past Surgical History  Procedure Laterality Date  . Tubal ligation  1989  . Mastectomy  09/2003    left  . US echocardiography  05/03/2004  . Electrocardiogram  05/02/2005  . Carpal tunnel release      bilateral  . Tonsillectomy      History   Social History  . Marital Status: Widowed    Spouse Name: N/A    Number of Children: 4  . Years of Education: N/A   Occupational History  . Retired    Social History Main Topics  . Smoking status: Never Smoker   . Smokeless tobacco: Never Used  . Alcohol Use: No  . Drug Use: No  . Sexual Activity: Not on file   Other Topics Concern  . Not on file   Social History Narrative    Current Outpatient Prescriptions on File Prior to Visit  Medication Sig Dispense Refill  . aspirin 81 MG tablet Take 81 mg by mouth daily.      . Calcium Carbonate (CALTRATE 600 PO) Take by mouth.    . losartan (COZAAR) 25 MG tablet Take 1 tablet (25 mg total) by mouth daily. 90 tablet 3  . metoprolol tartrate (LOPRESSOR) 25 MG tablet Take 12.5 mg by mouth 2 (two) times daily.    . Multiple Vitamins-Minerals (CENTRUM SILVER PO) Take 1 tablet by mouth daily.       . simvastatin (ZOCOR) 80 MG tablet TAKE 1 TABLET BY MOUTH ONCE DAILY 90 tablet 1  . Vitamin D, Ergocalciferol, (DRISDOL) 50000 UNITS CAPS Take 50,000 Units by mouth every 30 (thirty) days.       No current facility-administered medications on file prior to visit.    Allergies  Allergen Reactions  . Lisinopril     REACTION: Cough    Family History  Problem Relation Age of Onset  . Cancer Neg Hx   . Breast cancer Sister     x 2  . Breast cancer Maternal Aunt   . Breast cancer Other     nephew    BP 122/84 mmHg  Pulse 74  Temp(Src) 98.2 F (36.8 C) (Oral)  Ht 5' 3.5" (1.613 m)  Wt 151 lb (68.493 kg)  BMI 26.33 kg/m2  SpO2 94%  Review of Systems  Constitutional: Negative for unexpected weight change.  HENT: Negative for hearing loss.   Eyes: Negative for visual disturbance.  Respiratory: Negative for shortness of breath.   Cardiovascular: Negative for chest pain.  Gastrointestinal: Negative for anal bleeding.  Endocrine: Negative for cold intolerance.  Genitourinary: Negative for hematuria.  Musculoskeletal: Negative for back pain.  Allergic/Immunologic: Negative for environmental allergies.  Neurological: Negative for syncope and headaches.  Hematological: Does not bruise/bleed easily.  Psychiatric/Behavioral: Negative for dysphoric mood.       Objective:   Physical Exam VS: see vs page GEN: no distress HEAD: head: no deformity.   eyes: no periorbital swelling, no proptosis.   external nose and ears are normal.   mouth: no lesion seen.   NECK: supple, thyroid is not enlarged.  CHEST WALL: no deformity. LUNGS:  Clear to auscultation.   CV: reg rate and rhythm, no murmur.  ABD: abdomen is soft, nontender.  no hepatosplenomegaly.  not distended.  no hernia.   MUSCULOSKELETAL: muscle bulk and strength are grossly normal.  no obvious joint swelling.  gait is normal and steady PULSES: dorsalis pedis intact bilat.  no carotid bruit NEURO:  cn 2-12 grossly  intact.   readily moves all 4's.  sensation is intact to touch on the feet SKIN:  Normal texture and temperature.  No rash or suspicious lesion is visible.   NODES:  None palpable at the neck.  PSYCH: alert, well-oriented.  Does not appear anxious nor depressed.      Assessment & Plan:  Wellness visit today, with problems stable, except as noted.    SEPARATE EVALUATION FOLLOWS--EACH PROBLEM HERE IS NEW, NOT RESPONDING TO TREATMENT, OR POSES SIGNIFICANT RISK TO THE PATIENT'S HEALTH: HISTORY OF THE PRESENT ILLNESS: Pt states 1 month of moderate pain at the plantar aspect of the right heel.  No assoc numbness. Pt wants to reduce the zocor, as she does not like the way it makes her feel.   PAST MEDICAL HISTORY reviewed and up to date today REVIEW OF SYSTEMS: Denies fever and open wound PHYSICAL EXAMINATION: VITAL SIGNS:  See vs page GENERAL: no distress EXTEMITIES: no deformity.  no ulcer on the feet.  feet are of normal color and temp.  no edema LAB/XRAY RESULTS: X-ray: normal Lab Results  Component Value Date   CHOL 134 08/19/2014   HDL 59.30 08/19/2014   LDLCALC 45 08/19/2014   LDLDIRECT 127.8 06/21/2011   TRIG 150.0* 08/19/2014   CHOLHDL 2 08/19/2014  IMPRESSION: Foot pain, new, uncertain etiology Dyslipidemia.  She can reduce rx, given intolerance. PLAN: call if you want to see podiatry Reduce zocor to 20/d.

## 2014-08-20 ENCOUNTER — Telehealth: Payer: Self-pay | Admitting: Endocrinology

## 2014-08-20 MED ORDER — SIMVASTATIN 20 MG PO TABS: 20.0000 mg | ORAL_TABLET | Freq: Every day | ORAL | Status: DC

## 2014-08-20 NOTE — Telephone Encounter (Signed)
please call patient: Based on the blood test results, you can reduce the zocor to 20 mg qd.  i have sent a prescription to your pharmacy

## 2014-08-21 NOTE — Telephone Encounter (Signed)
Lvom advising of note below. Requested call back if pt would like to discuss.  

## 2014-08-31 ENCOUNTER — Telehealth: Payer: Self-pay | Admitting: Endocrinology

## 2014-08-31 MED ORDER — SIMVASTATIN 20 MG PO TABS: 20.0000 mg | ORAL_TABLET | Freq: Every day | ORAL | Status: DC

## 2014-08-31 MED ORDER — LOSARTAN POTASSIUM 25 MG PO TABS: 25.0000 mg | ORAL_TABLET | Freq: Every day | ORAL | Status: DC

## 2014-08-31 NOTE — Telephone Encounter (Signed)
Patient called stating that her simvastatin was sent to [facility] and cost too much   Please send a new Rx to [facility] for simvastatin do not send to mail order Also please Rx of losartan as well to [facility]      Please advise    Thank you

## 2014-08-31 NOTE — Telephone Encounter (Signed)
Rx sent to pt's pharmacy per request.

## 2014-09-15 ENCOUNTER — Telehealth: Payer: Self-pay

## 2014-09-15 ENCOUNTER — Ambulatory Visit
Admission: RE | Admit: 2014-09-15 | Discharge: 2014-09-15 | Disposition: A | Discharge status: Home / Self Care | Payer: Medicare HMO | Source: Ambulatory Visit | Attending: Endocrinology | Admitting: Endocrinology

## 2014-09-15 ENCOUNTER — Encounter: Payer: Self-pay | Admitting: Endocrinology

## 2014-09-15 ENCOUNTER — Ambulatory Visit (INDEPENDENT_AMBULATORY_CARE_PROVIDER_SITE_OTHER): Payer: Medicare HMO | Admitting: Endocrinology

## 2014-09-15 VITALS — BP 138/92 | HR 62 | Temp 97.6°F | Wt 152.0 lb

## 2014-09-15 DIAGNOSIS — M542 Cervicalgia: Secondary | ICD-10-CM | POA: Insufficient documentation

## 2014-09-15 MED ORDER — DICLOFENAC SODIUM 1 % TD GEL: TRANSDERMAL | Status: DC

## 2014-09-15 MED ORDER — TRAMADOL-ACETAMINOPHEN 37.5-325 MG PO TABS: 1.0000 | ORAL_TABLET | Freq: Four times a day (QID) | ORAL | Status: DC | PRN

## 2014-09-15 MED ORDER — DICLOFENAC SODIUM 1 % TD GEL: 1.0000 "application " | Freq: Four times a day (QID) | TRANSDERMAL | Status: DC

## 2014-09-15 NOTE — Patient Instructions (Addendum)
X-rays are requested for you today.  We'll let you know about the results. i have sent a prescription to your pharmacy, for a cream to apply to the painful area.   Here is a pill to take by mouth, also as needed for the pain. Please continue the same medication for your blood pressure.

## 2014-09-15 NOTE — Progress Notes (Signed)
Subjective:    Patient ID: Autumn Galloway, female    DOB: [birth year], 81 y.o.   MRN: [id number]  HPI Pt states 2 weeks of moderate pain at the right lateral and posterior neck.  No assoc rash or numbness.   Past Medical History  Diagnosis Date  . THROMBOCYTOPENIA 12/25/2008  . RENAL INSUFFICIENCY 12/25/2008  . OSTEOPOROSIS 06/13/2010  . INTERNAL HEMORRHOIDS 09/09/2003  . HYPERTENSION 02/28/2007  . HYPERLIPIDEMIA 02/28/2007  . GERD 05/03/1998  . ESOPHAGEAL STRICTURE 05/03/1998  . DIZZINESS OR VERTIGO 02/28/2007  . DIVERTICULOSIS OF COLON 09/09/2003  . CAROTID ARTERY STENOSIS 06/13/2010  . BREAST CANCER, HX OF 02/28/2007  . Scoliosis   . Apical lung scarring     Chronic  . Vitamin D deficiency   . Hearing loss     Past Surgical History  Procedure Laterality Date  . Tubal ligation  1989  . Mastectomy  09/2003    left  . US echocardiography  05/03/2004  . Electrocardiogram  05/02/2005  . Carpal tunnel release      bilateral  . Tonsillectomy      History   Social History  . Marital Status: Widowed    Spouse Name: N/A  . Number of Children: 4  . Years of Education: N/A   Occupational History  . Retired    Social History Main Topics  . Smoking status: Never Smoker   . Smokeless tobacco: Never Used  . Alcohol Use: No  . Drug Use: No  . Sexual Activity: Not on file   Other Topics Concern  . Not on file   Social History Narrative    Current Outpatient Prescriptions on File Prior to Visit  Medication Sig Dispense Refill  . aspirin 81 MG tablet Take 81 mg by mouth daily.      . Calcium Carbonate (CALTRATE 600 PO) Take by mouth.    . losartan (COZAAR) 25 MG tablet Take 1 tablet (25 mg total) by mouth daily. 90 tablet 1  . metoprolol tartrate (LOPRESSOR) 25 MG tablet Take 12.5 mg by mouth 2 (two) times daily.    . Multiple Vitamins-Minerals (CENTRUM SILVER PO) Take 1 tablet by mouth daily.      . simvastatin (ZOCOR) 20 MG tablet Take 1 tablet (20 mg total) by mouth at  bedtime. 90 tablet 1  . Vitamin D, Ergocalciferol, (DRISDOL) 50000 UNITS CAPS Take 50,000 Units by mouth every 30 (thirty) days.       No current facility-administered medications on file prior to visit.    Allergies  Allergen Reactions  . Lisinopril     REACTION: Cough    Family History  Problem Relation Age of Onset  . Cancer Neg Hx   . Breast cancer Sister     x 2  . Breast cancer Maternal Aunt   . Breast cancer Other     nephew    BP 138/92 mmHg  Pulse 62  Temp(Src) 97.6 F (36.4 C) (Oral)  Wt 152 lb (68.947 kg)  SpO2 94%    Review of Systems Denies fever and earache.      Objective:   Physical Exam VITAL SIGNS:  See vs page.  GENERAL: no distress. Neck: full ROM, but twisting to the right is painful. right eac and tm is normal. Skin: no rash on the neck. Neuro: sensation is intact to touch on the UE's.  X-rays: severe oa     Assessment & Plan:  Neck pain, new, most likely due to severe OA.  Pt declines ESR HTN: prob exac by pain.  Patient is advised the following: Patient Instructions  X-rays are requested for you today.  We'll let you know about the results. i have sent a prescription to your pharmacy, for a cream to apply to the painful area.   Here is a pill to take by mouth, also as needed for the pain. Please continue the same medication for your blood pressure.

## 2014-09-15 NOTE — Telephone Encounter (Signed)
2-4 grams per application

## 2014-09-15 NOTE — Telephone Encounter (Signed)
Rx sent 

## 2014-09-15 NOTE — Telephone Encounter (Signed)
Received a fax from pt's pharamcy requesting clarification on Voltaren Gel. How many grams per day is the pt suppose to be applying? Thanks!

## 2014-09-16 ENCOUNTER — Telehealth: Payer: Self-pay | Admitting: Endocrinology

## 2014-09-16 MED ORDER — DICLOFENAC SODIUM 1 % TD GEL: TRANSDERMAL | Status: DC

## 2014-09-16 NOTE — Telephone Encounter (Signed)
Ammon call and would like to know how many times a day patient is suppose to use the Diclofenac Sodium Gel, please advise

## 2014-09-16 NOTE — Telephone Encounter (Signed)
Rx sent to pharmacy   

## 2014-09-16 NOTE — Telephone Encounter (Signed)
Pharmacy notified of rx instructions

## 2014-09-16 NOTE — Telephone Encounter (Signed)
Patient called stating that her Rx needs directions per her pharmacy  Rx: Voltaren Gel  Pharmacy: [facility]  Thank you

## 2015-01-28 ENCOUNTER — Ambulatory Visit (INDEPENDENT_AMBULATORY_CARE_PROVIDER_SITE_OTHER): Payer: Medicare HMO | Admitting: Endocrinology

## 2015-01-28 ENCOUNTER — Encounter: Payer: Self-pay | Admitting: Endocrinology

## 2015-01-28 ENCOUNTER — Telehealth: Payer: Self-pay

## 2015-01-28 VITALS — BP 120/86 | HR 63 | Temp 97.5°F | Resp 16 | Ht 65.0 in | Wt 148.0 lb

## 2015-01-28 DIAGNOSIS — R413 Other amnesia: Secondary | ICD-10-CM

## 2015-01-28 DIAGNOSIS — N259 Disorder resulting from impaired renal tubular function, unspecified: Secondary | ICD-10-CM

## 2015-01-28 DIAGNOSIS — R32 Unspecified urinary incontinence: Secondary | ICD-10-CM | POA: Insufficient documentation

## 2015-01-28 DIAGNOSIS — D696 Thrombocytopenia, unspecified: Secondary | ICD-10-CM | POA: Diagnosis not present

## 2015-01-28 LAB — URINALYSIS, ROUTINE W REFLEX MICROSCOPIC
BILIRUBIN URINE: NEGATIVE
Hgb urine dipstick: NEGATIVE
KETONES UR: NEGATIVE
Nitrite: NEGATIVE
PH: 5.5 (ref 5.0–8.0)
RBC / HPF: NONE SEEN (ref 0–?)
Specific Gravity, Urine: >=1.03 — AB (ref 1.000–1.030)
UROBILINOGEN UA: 0.2 (ref 0.0–1.0)
Urine Glucose: NEGATIVE

## 2015-01-28 LAB — CBC WITH DIFFERENTIAL/PLATELET
Basophils Absolute: 0 10*3/uL (ref 0.0–0.1)
Basophils Relative: 0.5 % (ref 0.0–3.0)
Eosinophils Absolute: 0.6 10*3/uL (ref 0.0–0.7)
Eosinophils Relative: 7.7 % — ABNORMAL HIGH (ref 0.0–5.0)
HCT: 36.2 % (ref 36.0–46.0)
HEMOGLOBIN: 12.1 g/dL (ref 12.0–15.0)
Lymphocytes Relative: 19.2 % (ref 12.0–46.0)
Lymphs Abs: 1.5 10*3/uL (ref 0.7–4.0)
MCHC: 33.3 g/dL (ref 30.0–36.0)
MCV: 85.6 fl (ref 78.0–100.0)
MONOS PCT: 7.3 % (ref 3.0–12.0)
Monocytes Absolute: 0.6 10*3/uL (ref 0.1–1.0)
NEUTROS ABS: 5.2 10*3/uL (ref 1.4–7.7)
Neutrophils Relative %: 65.3 % (ref 43.0–77.0)
Platelets: 226 10*3/uL (ref 150.0–400.0)
RBC: 4.23 Mil/uL (ref 3.87–5.11)
RDW: 14.1 % (ref 11.5–15.5)
WBC: 8 10*3/uL (ref 4.0–10.5)

## 2015-01-28 LAB — VITAMIN B12: VITAMIN B 12: 661 pg/mL (ref 211–911)

## 2015-01-28 LAB — BASIC METABOLIC PANEL
BUN: 23 mg/dL (ref 6–23)
CO2: 29 mEq/L (ref 19–32)
Calcium: 9.6 mg/dL (ref 8.4–10.5)
Chloride: 101 mEq/L (ref 96–112)
Creatinine, Ser: 1.16 mg/dL (ref 0.40–1.20)
GFR: 47.56 mL/min — AB (ref >60.00)
Glucose, Bld: 86 mg/dL (ref 70–99)
Potassium: 4.2 mEq/L (ref 3.5–5.1)
SODIUM: 137 meq/L (ref 135–145)

## 2015-01-28 MED ORDER — METOPROLOL SUCCINATE ER 25 MG PO TB24: 12.5000 mg | ORAL_TABLET | Freq: Every day | ORAL | Status: DC

## 2015-01-28 NOTE — Patient Instructions (Addendum)
blood tests are requested for you today.  We'll let you know about the results. Let's check the MRI of the brain.  you will receive a phone call, about a day and time for an appointment. It is medically necessary for you to go to assisted living.   It is unsafe for you to drive.   Please reduce the metoprolol to 1/2 pill daily (i have sent a prescription to your pharmacy, for a once a day form). Please come back for a follow-up appointment in 3 months.  Please bring your medication bottles then.

## 2015-01-28 NOTE — Telephone Encounter (Signed)
metroprpral tartrate

## 2015-01-28 NOTE — Progress Notes (Signed)
Subjective:    Patient ID: Autumn Galloway, female    DOB: [birth year], 81 y.o.   MRN: [id number]  HPI Pt states a few years of moderate difficulty with ambulation, and assoc memory loss.  She has fallen several times, with mild scrapes on the legs.   She does not know if she takes losartan or not. Past Medical History  Diagnosis Date  . THROMBOCYTOPENIA 12/25/2008  . RENAL INSUFFICIENCY 12/25/2008  . OSTEOPOROSIS 06/13/2010  . INTERNAL HEMORRHOIDS 09/09/2003  . HYPERTENSION 02/28/2007  . HYPERLIPIDEMIA 02/28/2007  . GERD 05/03/1998  . ESOPHAGEAL STRICTURE 05/03/1998  . DIZZINESS OR VERTIGO 02/28/2007  . DIVERTICULOSIS OF COLON 09/09/2003  . CAROTID ARTERY STENOSIS 06/13/2010  . BREAST CANCER, HX OF 02/28/2007  . Scoliosis   . Apical lung scarring     Chronic  . Vitamin D deficiency   . Hearing loss     Past Surgical History  Procedure Laterality Date  . Tubal ligation  1989  . Mastectomy  09/2003    left  . US echocardiography  05/03/2004  . Electrocardiogram  05/02/2005  . Carpal tunnel release      bilateral  . Tonsillectomy      History   Social History  . Marital Status: Widowed    Spouse Name: N/A  . Number of Children: 4  . Years of Education: N/A   Occupational History  . Retired    Social History Main Topics  . Smoking status: Never Smoker   . Smokeless tobacco: Never Used  . Alcohol Use: No  . Drug Use: No  . Sexual Activity: Not on file   Other Topics Concern  . Not on file   Social History Narrative    Current Outpatient Prescriptions on File Prior to Visit  Medication Sig Dispense Refill  . aspirin 81 MG tablet Take 81 mg by mouth daily.      . Multiple Vitamins-Minerals (CENTRUM SILVER PO) Take 1 tablet by mouth daily.      . simvastatin (ZOCOR) 20 MG tablet Take 1 tablet (20 mg total) by mouth at bedtime. 90 tablet 1  . Vitamin D, Ergocalciferol, (DRISDOL) 50000 UNITS CAPS Take 50,000 Units by mouth every 30 (thirty) days.      . Calcium Carbonate  (CALTRATE 600 PO) Take by mouth.    . diclofenac sodium (VOLTAREN) 1 % GEL Use 2-4 grams 4 times per day as needed for pain. (Patient not taking: Reported on 01/28/2015) 100 g 3  . losartan (COZAAR) 25 MG tablet Take 1 tablet (25 mg total) by mouth daily. (Patient not taking: Reported on 01/28/2015) 90 tablet 1   No current facility-administered medications on file prior to visit.    Allergies  Allergen Reactions  . Lisinopril     REACTION: Cough    Family History  Problem Relation Age of Onset  . Cancer Neg Hx   . Breast cancer Sister     x 2  . Breast cancer Maternal Aunt   . Breast cancer Other     nephew    BP 120/86 mmHg  Pulse 63  Temp(Src) 97.5 F (36.4 C) (Oral)  Resp 16  Ht 5\' 5"  (1.651 m)  Wt 148 lb (67.132 kg)  BMI 24.63 kg/m2  SpO2 96%  Review of Systems Denies headache and visual loss.  She has intermittent urinary incontinence and depression.      Objective:   Physical Exam VITAL SIGNS:  See vs page GENERAL: no distress Gait: slow  but steady. Neuro: sensation is intact to touch on the feet.  Skin: mild abrasions on the legs.   PSYCH: Alert and well-oriented.  Does not appear anxious nor depressed.         Assessment & Plan:  Gait difficulty, new. Depression, new: She declines aricept and SSRI HTN: well-controlled.  She may be having episodes of bradycardia. Urinary incontinence, new.    Patient is advised the following: Patient Instructions  blood tests are requested for you today.  We'll let you know about the results. Let's check the MRI of the brain.  you will receive a phone call, about a day and time for an appointment. It is medically necessary for you to go to assisted living.   It is unsafe for you to drive.   Please reduce the metoprolol to 1/2 pill daily (i have sent a prescription to your pharmacy, for a once a day form). Please come back for a follow-up appointment in 3 months.  Please bring your medication bottles then.

## 2015-02-02 ENCOUNTER — Encounter: Payer: Self-pay | Admitting: Genetic Counselor

## 2015-02-04 ENCOUNTER — Encounter: Payer: Self-pay | Admitting: Genetic Counselor

## 2015-02-04 ENCOUNTER — Ambulatory Visit: Payer: Medicare HMO | Admitting: Endocrinology

## 2015-02-04 DIAGNOSIS — Z1379 Encounter for other screening for genetic and chromosomal anomalies: Secondary | ICD-10-CM | POA: Insufficient documentation

## 2015-02-05 ENCOUNTER — Telehealth: Payer: Self-pay | Admitting: Genetic Counselor

## 2015-02-05 NOTE — Telephone Encounter (Signed)
Discussed with Autumn Galloway that her previous genetic testing from 2008 has been just recently updated.  This is good news, and I would like to discuss this more with her.  She was presently shopping for groceries and was having a difficult time hearing.  We made plans to speak again by phone on Monday--late afternoon would be the best time to call.

## 2015-02-08 ENCOUNTER — Telehealth: Payer: Self-pay | Admitting: Genetic Counselor

## 2015-02-08 NOTE — Telephone Encounter (Signed)
Discussed with Autumn Galloway that her genetic test result which she received back in 2008 has been updated from a variant of uncertain significance (VUS) to a negative result (i.e. normal result).  This means that this result has now been classified as a benign genetic change and this would not cause her to be at an increased risk for cancer.  Autumn Galloway also mentioned that she does not want to do any further genetic testing (in regards to the recall letter that was sent out about updated genetic testing options).  She may call us for any further questions or if she changes her mind about additional genetic testing.

## 2015-02-10 ENCOUNTER — Encounter: Payer: Self-pay | Admitting: Endocrinology

## 2015-02-10 ENCOUNTER — Ambulatory Visit (INDEPENDENT_AMBULATORY_CARE_PROVIDER_SITE_OTHER): Payer: Medicare HMO | Admitting: Endocrinology

## 2015-02-10 VITALS — BP 110/78 | HR 71 | Temp 97.6°F | Resp 16 | Ht 65.0 in | Wt 149.0 lb

## 2015-02-10 DIAGNOSIS — Z111 Encounter for screening for respiratory tuberculosis: Secondary | ICD-10-CM | POA: Diagnosis not present

## 2015-02-10 NOTE — Progress Notes (Signed)
Subjective:    Patient ID: Autumn Galloway, female    DOB: October 20, 1930, 81 y.o.   MRN: [id number]  HPI dtr Autumn Galloway) is POA and DPAHC.  Pt takes BP meds as rx'ed.  No change in chronic memory loss. She checks BP at home, and it is often low. Past Medical History  Diagnosis Date  . THROMBOCYTOPENIA 12/25/2008  . RENAL INSUFFICIENCY 12/25/2008  . OSTEOPOROSIS 06/13/2010  . INTERNAL HEMORRHOIDS 09/09/2003  . HYPERTENSION 02/28/2007  . HYPERLIPIDEMIA 02/28/2007  . GERD 05/03/1998  . ESOPHAGEAL STRICTURE 05/03/1998  . DIZZINESS OR VERTIGO 02/28/2007  . DIVERTICULOSIS OF COLON 09/09/2003  . CAROTID ARTERY STENOSIS 06/13/2010  . BREAST CANCER, HX OF 02/28/2007  . Scoliosis   . Apical lung scarring     Chronic  . Vitamin D deficiency   . Hearing loss     Past Surgical History  Procedure Laterality Date  . Tubal ligation  1989  . Mastectomy  09/2003    left  . US echocardiography  05/03/2004  . Electrocardiogram  05/02/2005  . Carpal tunnel release      bilateral  . Tonsillectomy      History   Social History  . Marital Status: Widowed    Spouse Name: N/A  . Number of Children: 4  . Years of Education: N/A   Occupational History  . Retired    Social History Main Topics  . Smoking status: Never Smoker   . Smokeless tobacco: Never Used  . Alcohol Use: No  . Drug Use: No  . Sexual Activity: Not on file   Other Topics Concern  . Not on file   Social History Narrative    Current Outpatient Prescriptions on File Prior to Visit  Medication Sig Dispense Refill  . aspirin 81 MG tablet Take 81 mg by mouth daily.      . Calcium Carbonate (CALTRATE 600 PO) Take by mouth.    . diclofenac sodium (VOLTAREN) 1 % GEL Use 2-4 grams 4 times per day as needed for pain. 100 g 3  . losartan (COZAAR) 25 MG tablet Take 1 tablet (25 mg total) by mouth daily. 90 tablet 1  . metoprolol succinate (TOPROL XL) 25 MG 24 hr tablet Take 0.5 tablets (12.5 mg total) by mouth daily. 15 tablet 11  .  Multiple Vitamins-Minerals (CENTRUM SILVER PO) Take 1 tablet by mouth daily.      . simvastatin (ZOCOR) 20 MG tablet Take 1 tablet (20 mg total) by mouth at bedtime. 90 tablet 1  . Vitamin D, Ergocalciferol, (DRISDOL) 50000 UNITS CAPS Take 50,000 Units by mouth every 30 (thirty) days.       No current facility-administered medications on file prior to visit.    Allergies  Allergen Reactions  . Lisinopril     REACTION: Cough    Family History  Problem Relation Age of Onset  . Cancer Neg Hx   . Breast cancer Sister     x 2  . Breast cancer Maternal Aunt   . Breast cancer Other     nephew    BP 110/78 mmHg  Pulse 71  Temp(Src) 97.6 F (36.4 C) (Oral)  Resp 16  Ht 5\' 5"  (1.651 m)  Wt 149 lb (67.586 kg)  BMI 24.79 kg/m2  SpO2 95%  Review of Systems Denies LOC.      Objective:   Physical Exam VITAL SIGNS:  See vs page GENERAL: no distress Ext: no edema Gait: slow but steady.  Assessment & Plan:  HTN: well-controlled.  She says it is low at home, but it is normal today.   memory loss, unchanged.  i did forms for assisted living.    Patient is advised the following: Patient Instructions  Please come back in 2 days to read skin test, and to get forms.   Please come back for a follow-up appointment in 3 months.  Please continue the same medications for your blood pressure.

## 2015-02-10 NOTE — Patient Instructions (Addendum)
Please come back in 2 days to read skin test, and to get forms.   Please come back for a follow-up appointment in 3 months.  Please continue the same medications for your blood pressure.

## 2015-02-12 ENCOUNTER — Ambulatory Visit (HOSPITAL_COMMUNITY)
Admission: RE | Admit: 2015-02-12 | Discharge: 2015-02-12 | Disposition: A | Discharge status: Home / Self Care | Payer: Medicare HMO | Source: Ambulatory Visit | Attending: Endocrinology | Admitting: Endocrinology

## 2015-02-12 ENCOUNTER — Encounter: Payer: Self-pay | Admitting: Endocrinology

## 2015-02-12 ENCOUNTER — Ambulatory Visit (INDEPENDENT_AMBULATORY_CARE_PROVIDER_SITE_OTHER): Payer: Medicare HMO | Admitting: Endocrinology

## 2015-02-12 VITALS — BP 146/86 | HR 70 | Temp 97.5°F | Resp 16 | Ht 65.0 in | Wt 149.0 lb

## 2015-02-12 DIAGNOSIS — R930 Abnormal findings on diagnostic imaging of skull and head, not elsewhere classified: Secondary | ICD-10-CM | POA: Diagnosis not present

## 2015-02-12 DIAGNOSIS — I10 Essential (primary) hypertension: Secondary | ICD-10-CM

## 2015-02-12 DIAGNOSIS — I739 Peripheral vascular disease, unspecified: Secondary | ICD-10-CM | POA: Diagnosis not present

## 2015-02-12 DIAGNOSIS — R413 Other amnesia: Secondary | ICD-10-CM | POA: Diagnosis present

## 2015-02-12 LAB — TB SKIN TEST: TB Skin Test: NEGATIVE

## 2015-02-12 LAB — HM MAMMOGRAPHY

## 2015-02-12 NOTE — Patient Instructions (Signed)
Please continue the same medications for your blood pressure. Here are your forms.   Please come back for a follow-up appointment in 3 months

## 2015-02-12 NOTE — Progress Notes (Signed)
Subjective:    Patient ID: Autumn Galloway, female    DOB: 08/23/1930, 81 y.o.   MRN: [id number]  HPI Pt returns for f/u of memory loss.  She says sxs are unchanged.  She is going to assisted living, and i completed forms today. Past Medical History  Diagnosis Date  . THROMBOCYTOPENIA 12/25/2008  . RENAL INSUFFICIENCY 12/25/2008  . OSTEOPOROSIS 06/13/2010  . INTERNAL HEMORRHOIDS 09/09/2003  . HYPERTENSION 02/28/2007  . HYPERLIPIDEMIA 02/28/2007  . GERD 05/03/1998  . ESOPHAGEAL STRICTURE 05/03/1998  . DIZZINESS OR VERTIGO 02/28/2007  . DIVERTICULOSIS OF COLON 09/09/2003  . CAROTID ARTERY STENOSIS 06/13/2010  . BREAST CANCER, HX OF 02/28/2007  . Scoliosis   . Apical lung scarring     Chronic  . Vitamin D deficiency   . Hearing loss     Past Surgical History  Procedure Laterality Date  . Tubal ligation  1989  . Mastectomy  09/2003    left  . US echocardiography  05/03/2004  . Electrocardiogram  05/02/2005  . Carpal tunnel release      bilateral  . Tonsillectomy      History   Social History  . Marital Status: Widowed    Spouse Name: N/A  . Number of Children: 4  . Years of Education: N/A   Occupational History  . Retired    Social History Main Topics  . Smoking status: Never Smoker   . Smokeless tobacco: Never Used  . Alcohol Use: No  . Drug Use: No  . Sexual Activity: Not on file   Other Topics Concern  . Not on file   Social History Narrative    Current Outpatient Prescriptions on File Prior to Visit  Medication Sig Dispense Refill  . aspirin 81 MG tablet Take 81 mg by mouth daily.      . Calcium Carbonate (CALTRATE 600 PO) Take by mouth.    . diclofenac sodium (VOLTAREN) 1 % GEL Use 2-4 grams 4 times per day as needed for pain. 100 g 3  . losartan (COZAAR) 25 MG tablet Take 1 tablet (25 mg total) by mouth daily. 90 tablet 1  . metoprolol succinate (TOPROL XL) 25 MG 24 hr tablet Take 0.5 tablets (12.5 mg total) by mouth daily. 15 tablet 11  . Multiple  Vitamins-Minerals (CENTRUM SILVER PO) Take 1 tablet by mouth daily.      . simvastatin (ZOCOR) 20 MG tablet Take 1 tablet (20 mg total) by mouth at bedtime. 90 tablet 1  . Vitamin D, Ergocalciferol, (DRISDOL) 50000 UNITS CAPS Take 50,000 Units by mouth every 30 (thirty) days.       No current facility-administered medications on file prior to visit.    Allergies  Allergen Reactions  . Lisinopril     REACTION: Cough    Family History  Problem Relation Age of Onset  . Cancer Neg Hx   . Breast cancer Sister     x 2  . Breast cancer Maternal Aunt   . Breast cancer Other     nephew    BP 146/86 mmHg  Pulse 70  Temp(Src) 97.5 F (36.4 C) (Oral)  Resp 16  Ht 5\' 5"  (1.651 m)  Wt 149 lb (67.586 kg)  BMI 24.79 kg/m2  SpO2 97%  Review of Systems She has intermittent falls.      Objective:   Physical Exam VITAL SIGNS:  See vs page GENERAL: no distress MMSE: Orientation to time 5 From broadest to most narrow. Orientation to place  5 From broadest to most narrow.  Registration 3 Repeating named prompts  Attention and calculation 5 spelling "world" backwards Recall 3 Registration recall  Language 2 Naming a pencil and a watch  Repetition 1 Speaking back a phrase  Complex commands 6 draw figure Total: 30/30     Assessment & Plan:  Memory loss,stable. HTN: with mild exacerbation  Patient is advised the following: Patient Instructions  Please continue the same medications for your blood pressure. Here are your forms.   Please come back for a follow-up appointment in 3 months

## 2015-02-15 ENCOUNTER — Encounter: Payer: Self-pay | Admitting: Endocrinology

## 2015-03-05 ENCOUNTER — Other Ambulatory Visit: Payer: Self-pay

## 2015-03-05 ENCOUNTER — Telehealth: Payer: Self-pay | Admitting: Endocrinology

## 2015-03-05 ENCOUNTER — Other Ambulatory Visit: Payer: Self-pay | Admitting: Endocrinology

## 2015-03-05 NOTE — Telephone Encounter (Signed)
Can we track down the insurance paperwork we did for her and if so can she can come pick them up asap

## 2015-03-05 NOTE — Telephone Encounter (Signed)
Pt advised forms have been sent to scanning and we do not have access at this time.

## 2015-03-24 ENCOUNTER — Telehealth: Payer: Self-pay

## 2015-03-24 NOTE — Telephone Encounter (Signed)
Pt will get B/P recheck on f/u visit 04/30/2015

## 2015-04-12 ENCOUNTER — Telehealth: Payer: Self-pay | Admitting: Endocrinology

## 2015-04-12 NOTE — Telephone Encounter (Signed)
Larena Glassman from [facility] would like for Dr. Loanne Drilling nurse to please call she has some questions regarding the patient   Please advise   Thank you

## 2015-04-13 NOTE — Telephone Encounter (Signed)
Requested call back from [place] to discuss.

## 2015-04-14 NOTE — Telephone Encounter (Signed)
I contacted the pt. Pt wanted to verify if we had recevied a medical records release from her insurance company. I advised the pt. We send the medical release forms to our medical records department on [facility]. Pt stated she will contact them.

## 2015-04-14 NOTE — Telephone Encounter (Signed)
Patient ask you to call her ,it is important

## 2015-04-16 ENCOUNTER — Telehealth: Payer: Self-pay | Admitting: Endocrinology

## 2015-04-16 NOTE — Telephone Encounter (Signed)
Information has been faxed  

## 2015-04-16 NOTE — Telephone Encounter (Signed)
Nursing facility that Autumn Galloway is currently in would like to know her correct dosage instruction   Rx: Toporol  Please fax dosage to: [facility] [phone]   Please advise    Thank you

## 2015-04-30 ENCOUNTER — Ambulatory Visit: Payer: Medicare HMO | Admitting: Endocrinology

## 2015-05-27 DIAGNOSIS — D638 Anemia in other chronic diseases classified elsewhere: Secondary | ICD-10-CM | POA: Diagnosis not present

## 2015-05-27 DIAGNOSIS — N182 Chronic kidney disease, stage 2 (mild): Secondary | ICD-10-CM | POA: Diagnosis not present

## 2015-05-31 ENCOUNTER — Ambulatory Visit: Payer: Medicare HMO | Admitting: Endocrinology

## 2015-06-09 ENCOUNTER — Encounter: Payer: Self-pay | Admitting: Endocrinology

## 2015-06-09 ENCOUNTER — Ambulatory Visit (INDEPENDENT_AMBULATORY_CARE_PROVIDER_SITE_OTHER): Payer: Medicare HMO | Admitting: Endocrinology

## 2015-06-09 VITALS — BP 122/64 | HR 78 | Temp 97.7°F | Ht 65.0 in | Wt 148.0 lb

## 2015-06-09 DIAGNOSIS — E559 Vitamin D deficiency, unspecified: Secondary | ICD-10-CM | POA: Diagnosis not present

## 2015-06-09 DIAGNOSIS — I129 Hypertensive chronic kidney disease with stage 1 through stage 4 chronic kidney disease, or unspecified chronic kidney disease: Secondary | ICD-10-CM | POA: Diagnosis not present

## 2015-06-09 DIAGNOSIS — I10 Essential (primary) hypertension: Secondary | ICD-10-CM | POA: Diagnosis not present

## 2015-06-09 DIAGNOSIS — N182 Chronic kidney disease, stage 2 (mild): Secondary | ICD-10-CM | POA: Diagnosis not present

## 2015-06-09 MED ORDER — VITAMIN D (ERGOCALCIFEROL) 1.25 MG (50000 UNIT) PO CAPS: 50000.0000 [IU] | ORAL_CAPSULE | ORAL | Status: DC

## 2015-06-09 NOTE — Progress Notes (Signed)
   Subjective:    Patient ID: Autumn Galloway, female    DOB: 1930-11-24, 81 y.o.   MRN: [id number]  HPI pt states she feels well in general.  She takes her meds as rx'ed.  Denies chest pain.   Past Medical History  Diagnosis Date  . THROMBOCYTOPENIA 12/25/2008  . RENAL INSUFFICIENCY 12/25/2008  . OSTEOPOROSIS 06/13/2010  . INTERNAL HEMORRHOIDS 09/09/2003  . HYPERTENSION 02/28/2007  . HYPERLIPIDEMIA 02/28/2007  . GERD 05/03/1998  . ESOPHAGEAL STRICTURE 05/03/1998  . DIZZINESS OR VERTIGO 02/28/2007  . DIVERTICULOSIS OF COLON 09/09/2003  . CAROTID ARTERY STENOSIS 06/13/2010  . BREAST CANCER, HX OF 02/28/2007  . Scoliosis   . Apical lung scarring     Chronic  . Vitamin D deficiency   . Hearing loss     Past Surgical History  Procedure Laterality Date  . Tubal ligation  1989  . Mastectomy  09/2003    left  . US echocardiography  05/03/2004  . Electrocardiogram  05/02/2005  . Carpal tunnel release      bilateral  . Tonsillectomy      Social History   Social History  . Marital Status: Widowed    Spouse Name: N/A  . Number of Children: 4  . Years of Education: N/A   Occupational History  . Retired    Social History Main Topics  . Smoking status: Never Smoker   . Smokeless tobacco: Never Used  . Alcohol Use: No  . Drug Use: No  . Sexual Activity: Not on file   Other Topics Concern  . Not on file   Social History Narrative    Current Outpatient Prescriptions on File Prior to Visit  Medication Sig Dispense Refill  . aspirin 81 MG tablet Take 81 mg by mouth daily.      . Calcium Carbonate (CALTRATE 600 PO) Take by mouth.    . losartan (COZAAR) 25 MG tablet Take 1 tablet (25 mg total) by mouth daily. 90 tablet 1  . metoprolol succinate (TOPROL XL) 25 MG 24 hr tablet Take 0.5 tablets (12.5 mg total) by mouth daily. 15 tablet 11  . Multiple Vitamins-Minerals (CENTRUM SILVER PO) Take 1 tablet by mouth daily.      . simvastatin (ZOCOR) 20 MG tablet TAKE ONE TABLET BY MOUTH AT  BEDTIME 90 tablet 0   No current facility-administered medications on file prior to visit.    Allergies  Allergen Reactions  . Lisinopril     REACTION: Cough    Family History  Problem Relation Age of Onset  . Cancer Neg Hx   . Breast cancer Sister     x 2  . Breast cancer Maternal Aunt   . Breast cancer Other     nephew    BP 122/64 mmHg  Pulse 78  Temp(Src) 97.7 F (36.5 C) (Oral)  Ht 5\' 5"  (1.651 m)  Wt 148 lb (67.132 kg)  BMI 24.63 kg/m2  SpO2 95%  Review of Systems Denies sob    Objective:   Physical Exam VITAL SIGNS:  See vs page.  GENERAL: no distress. LUNGS:  Clear to auscultation. HEART:  Regular rate and rhythm without murmurs noted. Normal S1,S2.       Assessment & Plan:  HTN: well-controlled.   Patient is advised the following: Patient Instructions  Please continue the same medications Please come back for a regular physical appointment in 3-4 months.

## 2015-06-09 NOTE — Patient Instructions (Signed)
Please continue the same medications Please come back for a regular physical appointment in 3-4 months.

## 2015-06-22 DIAGNOSIS — I10 Essential (primary) hypertension: Secondary | ICD-10-CM | POA: Diagnosis not present

## 2015-07-07 ENCOUNTER — Other Ambulatory Visit: Payer: Self-pay

## 2015-07-07 ENCOUNTER — Telehealth: Payer: Self-pay | Admitting: Endocrinology

## 2015-07-07 MED ORDER — VITAMIN D (ERGOCALCIFEROL) 1.25 MG (50000 UNIT) PO CAPS: 50000.0000 [IU] | ORAL_CAPSULE | ORAL | Status: DC

## 2015-07-07 MED ORDER — VITAMIN D (ERGOCALCIFEROL) 1.25 MG (50000 UNIT) PO CAPS
50000.0000 [IU] | ORAL_CAPSULE | ORAL | Status: DC
Start: 2015-07-07 — End: 2015-07-07

## 2015-07-07 NOTE — Telephone Encounter (Signed)
I contacted the pt. She stated her facility is requesting an updated rx fr her Vit D prescription. Rx has been sent to pt's facility.

## 2015-07-07 NOTE — Telephone Encounter (Signed)
please call patient's facility: D/c ergocalciferol

## 2015-07-07 NOTE — Telephone Encounter (Signed)
I contacted pt and advise we have faxed the rx to the facility.

## 2015-07-07 NOTE — Telephone Encounter (Signed)
Patient called stating that she would like for [place] to fax over her dosage instructions for her Vit D to [facility]  They currently have it Vit D daily   Please advise   Fax: [phone]  Thank you

## 2015-07-07 NOTE — Telephone Encounter (Signed)
Patient called stating that she needs to speak with Jinny Blossom its very important    Please advise

## 2015-07-08 ENCOUNTER — Telehealth: Payer: Self-pay | Admitting: Endocrinology

## 2015-07-08 NOTE — Telephone Encounter (Signed)
Pt and pt's facility advised of note below.

## 2015-07-08 NOTE — Telephone Encounter (Signed)
I contacted the pt and and advised Per Dr. Cordelia Pen insctructions to d/c her vitamin D. Pt voiced understanding and pt's facility has been notified.

## 2015-07-08 NOTE — Telephone Encounter (Signed)
Patient called stating she would like to have Megan please    Thank you

## 2015-07-27 ENCOUNTER — Ambulatory Visit (INDEPENDENT_AMBULATORY_CARE_PROVIDER_SITE_OTHER): Payer: Medicare HMO | Admitting: Endocrinology

## 2015-07-27 ENCOUNTER — Encounter: Payer: Self-pay | Admitting: Endocrinology

## 2015-07-27 VITALS — BP 132/86 | HR 74 | Temp 98.1°F | Ht 65.0 in | Wt 144.0 lb

## 2015-07-27 DIAGNOSIS — J209 Acute bronchitis, unspecified: Secondary | ICD-10-CM

## 2015-07-27 MED ORDER — AZITHROMYCIN 500 MG PO TABS: 500.0000 mg | ORAL_TABLET | Freq: Every day | ORAL | Status: DC

## 2015-07-27 NOTE — Patient Instructions (Addendum)
Here is a prescription, for an antibiotic pill.   I hope you feel better soon.  If you don't feel better by next week, please call back.  Please call sooner if you get worse.

## 2015-07-27 NOTE — Progress Notes (Signed)
Subjective:    Patient ID: Autumn Galloway, female    DOB: January 11, 1931, 82 y.o.   MRN: [id number]  HPI Pt states 5 days of moderate prod-quality cough in the chest, but no assoc sob.   Past Medical History  Diagnosis Date  . THROMBOCYTOPENIA 12/25/2008  . RENAL INSUFFICIENCY 12/25/2008  . OSTEOPOROSIS 06/13/2010  . INTERNAL HEMORRHOIDS 09/09/2003  . HYPERTENSION 02/28/2007  . HYPERLIPIDEMIA 02/28/2007  . GERD 05/03/1998  . ESOPHAGEAL STRICTURE 05/03/1998  . DIZZINESS OR VERTIGO 02/28/2007  . DIVERTICULOSIS OF COLON 09/09/2003  . CAROTID ARTERY STENOSIS 06/13/2010  . BREAST CANCER, HX OF 02/28/2007  . Scoliosis   . Apical lung scarring     Chronic  . Vitamin D deficiency   . Hearing loss     Past Surgical History  Procedure Laterality Date  . Tubal ligation  1989  . Mastectomy  09/2003    left  . US echocardiography  05/03/2004  . Electrocardiogram  05/02/2005  . Carpal tunnel release      bilateral  . Tonsillectomy      Social History   Social History  . Marital Status: Widowed    Spouse Name: N/A  . Number of Children: 4  . Years of Education: N/A   Occupational History  . Retired    Social History Main Topics  . Smoking status: Never Smoker   . Smokeless tobacco: Never Used  . Alcohol Use: No  . Drug Use: No  . Sexual Activity: Not on file   Other Topics Concern  . Not on file   Social History Narrative    Current Outpatient Prescriptions on File Prior to Visit  Medication Sig Dispense Refill  . aspirin 81 MG tablet Take 81 mg by mouth daily.      . Calcium Carbonate (CALTRATE 600 PO) Take by mouth.    . losartan (COZAAR) 25 MG tablet Take 1 tablet (25 mg total) by mouth daily. 90 tablet 1  . metoprolol succinate (TOPROL XL) 25 MG 24 hr tablet Take 0.5 tablets (12.5 mg total) by mouth daily. 15 tablet 11  . Multiple Vitamins-Minerals (CENTRUM SILVER PO) Take 1 tablet by mouth daily.      . simvastatin (ZOCOR) 20 MG tablet TAKE ONE TABLET BY MOUTH AT BEDTIME 90  tablet 0   No current facility-administered medications on file prior to visit.    Allergies  Allergen Reactions  . Lisinopril     REACTION: Cough    Family History  Problem Relation Age of Onset  . Cancer Neg Hx   . Breast cancer Sister     x 2  . Breast cancer Maternal Aunt   . Breast cancer Other     nephew    BP 132/86 mmHg  Pulse 74  Temp(Src) 98.1 F (36.7 C) (Oral)  Ht 5\' 5"  (1.651 m)  Wt 144 lb (65.318 kg)  BMI 23.96 kg/m2  SpO2 91%  Review of Systems Denies fever, nasal congestion, and earache.     Objective:   Physical Exam VITAL SIGNS:  See vs page GENERAL: no distress head: no deformity eyes: no periorbital swelling, no proptosis.  external nose and ears are normal.   mouth: no lesion seen.   LUNGS:  Clear to auscultation.        Assessment & Plan:  Acute bronchitis, new.  Patient is advised the following: Patient Instructions  Here is a prescription, for an antibiotic pill.   I hope you feel better soon.  If you don't feel better by next week, please call back.  Please call sooner if you get worse.

## 2015-09-24 DIAGNOSIS — H35363 Drusen (degenerative) of macula, bilateral: Secondary | ICD-10-CM | POA: Diagnosis not present

## 2015-09-28 DIAGNOSIS — Z961 Presence of intraocular lens: Secondary | ICD-10-CM | POA: Diagnosis not present

## 2015-10-06 DIAGNOSIS — R61 Generalized hyperhidrosis: Secondary | ICD-10-CM | POA: Diagnosis not present

## 2015-10-06 DIAGNOSIS — Z0100 Encounter for examination of eyes and vision without abnormal findings: Secondary | ICD-10-CM | POA: Diagnosis not present

## 2015-10-29 ENCOUNTER — Encounter: Payer: Self-pay | Admitting: Endocrinology

## 2015-10-29 ENCOUNTER — Ambulatory Visit (INDEPENDENT_AMBULATORY_CARE_PROVIDER_SITE_OTHER): Payer: Medicare HMO | Admitting: Endocrinology

## 2015-10-29 ENCOUNTER — Ambulatory Visit
Admission: RE | Admit: 2015-10-29 | Discharge: 2015-10-29 | Disposition: A | Discharge status: Home / Self Care | Payer: Medicare HMO | Source: Ambulatory Visit | Attending: Endocrinology | Admitting: Endocrinology

## 2015-10-29 VITALS — BP 138/80 | HR 78 | Temp 98.1°F | Ht 65.0 in | Wt 147.0 lb

## 2015-10-29 DIAGNOSIS — I10 Essential (primary) hypertension: Secondary | ICD-10-CM

## 2015-10-29 DIAGNOSIS — E785 Hyperlipidemia, unspecified: Secondary | ICD-10-CM | POA: Diagnosis not present

## 2015-10-29 DIAGNOSIS — J984 Other disorders of lung: Secondary | ICD-10-CM | POA: Diagnosis not present

## 2015-10-29 DIAGNOSIS — R062 Wheezing: Secondary | ICD-10-CM | POA: Diagnosis not present

## 2015-10-29 DIAGNOSIS — Z0000 Encounter for general adult medical examination without abnormal findings: Secondary | ICD-10-CM

## 2015-10-29 DIAGNOSIS — D696 Thrombocytopenia, unspecified: Secondary | ICD-10-CM

## 2015-10-29 DIAGNOSIS — R0602 Shortness of breath: Secondary | ICD-10-CM | POA: Diagnosis not present

## 2015-10-29 DIAGNOSIS — M810 Age-related osteoporosis without current pathological fracture: Secondary | ICD-10-CM | POA: Diagnosis not present

## 2015-10-29 NOTE — Progress Notes (Signed)
Subjective:    Patient ID: Autumn Galloway, female    DOB: 05/15/1931, 82 y.o.   MRN: [id number]  HPI Pt states few weeks of decreased hering from the left ear, but no assoc pain.  Past Medical History  Diagnosis Date  . THROMBOCYTOPENIA 12/25/2008  . RENAL INSUFFICIENCY 12/25/2008  . OSTEOPOROSIS 06/13/2010  . INTERNAL HEMORRHOIDS 09/09/2003  . HYPERTENSION 02/28/2007  . HYPERLIPIDEMIA 02/28/2007  . GERD 05/03/1998  . ESOPHAGEAL STRICTURE 05/03/1998  . DIZZINESS OR VERTIGO 02/28/2007  . DIVERTICULOSIS OF COLON 09/09/2003  . CAROTID ARTERY STENOSIS 06/13/2010  . BREAST CANCER, HX OF 02/28/2007  . Scoliosis   . Apical lung scarring     Chronic  . Vitamin D deficiency   . Hearing loss     Past Surgical History  Procedure Laterality Date  . Tubal ligation  1989  . Mastectomy  09/2003    left  . US echocardiography  05/03/2004  . Electrocardiogram  05/02/2005  . Carpal tunnel release      bilateral  . Tonsillectomy      Social History   Social History  . Marital Status: Widowed    Spouse Name: N/A  . Number of Children: 4  . Years of Education: N/A   Occupational History  . Retired    Social History Main Topics  . Smoking status: Never Smoker   . Smokeless tobacco: Never Used  . Alcohol Use: No  . Drug Use: No  . Sexual Activity: Not on file   Other Topics Concern  . Not on file   Social History Narrative    Current Outpatient Prescriptions on File Prior to Visit  Medication Sig Dispense Refill  . aspirin 81 MG tablet Take 81 mg by mouth daily.      Marland Kitchen azithromycin (ZITHROMAX) 500 MG tablet Take 1 tablet (500 mg total) by mouth daily. 5 tablet 0  . Calcium Carbonate (CALTRATE 600 PO) Take by mouth.    . losartan (COZAAR) 25 MG tablet Take 1 tablet (25 mg total) by mouth daily. 90 tablet 1  . metoprolol succinate (TOPROL XL) 25 MG 24 hr tablet Take 0.5 tablets (12.5 mg total) by mouth daily. 15 tablet 11  . Multiple Vitamins-Minerals (CENTRUM SILVER PO) Take 1 tablet  by mouth daily.      . simvastatin (ZOCOR) 20 MG tablet TAKE ONE TABLET BY MOUTH AT BEDTIME 90 tablet 0   No current facility-administered medications on file prior to visit.    Allergies  Allergen Reactions  . Lisinopril     REACTION: Cough    Family History  Problem Relation Age of Onset  . Cancer Neg Hx   . Breast cancer Sister     x 2  . Breast cancer Maternal Aunt   . Breast cancer Other     nephew    BP 138/80 mmHg  Pulse 78  Temp(Src) 98.1 F (36.7 C) (Oral)  Ht 5\' 5"  (1.651 m)  Wt 147 lb (66.679 kg)  BMI 24.46 kg/m2  SpO2 91%  Review of Systems Denies weight change and muscle cramps.  She has slight wheezing.      Objective:   Physical Exam VITAL SIGNS:  See vs page GENERAL: no distress Both eac's and tm's are normal LUNGS:  Clear to auscultation   i personally reviewed spirometry tracing (today): Indication: wheezing Impression: restr dz  CXR: scarring, mild    Assessment & Plan:  Cerumen impaction, new restr lung dz, new. HTN: well-controlled. Thrombocytopenia:  due for recheck.  Patient is advised the following: Patient Instructions  Please continue the same medications. A chest x-ray and blood tests requested for you today.  We'll let you know about the results. Try putting a few drops of peroxide into each ear, to loosen the wax.  Please come back for a regular physical appointment in 3-4 months.

## 2015-10-29 NOTE — Patient Instructions (Addendum)
Please continue the same medications. A chest x-ray and blood tests requested for you today.  We'll let you know about the results. Try putting a few drops of peroxide into each ear, to loosen the wax.  Please come back for a regular physical appointment in 3-4 months.

## 2015-10-31 DIAGNOSIS — H6123 Impacted cerumen, bilateral: Secondary | ICD-10-CM | POA: Diagnosis not present

## 2015-11-17 DIAGNOSIS — E559 Vitamin D deficiency, unspecified: Secondary | ICD-10-CM | POA: Diagnosis not present

## 2015-11-17 DIAGNOSIS — D638 Anemia in other chronic diseases classified elsewhere: Secondary | ICD-10-CM | POA: Diagnosis not present

## 2015-11-17 DIAGNOSIS — N182 Chronic kidney disease, stage 2 (mild): Secondary | ICD-10-CM | POA: Diagnosis not present

## 2015-12-07 ENCOUNTER — Telehealth: Payer: Self-pay | Admitting: Endocrinology

## 2015-12-07 DIAGNOSIS — D509 Iron deficiency anemia, unspecified: Secondary | ICD-10-CM | POA: Diagnosis not present

## 2015-12-07 NOTE — Telephone Encounter (Signed)
See note below and please advise, Thanks! 

## 2015-12-07 NOTE — Telephone Encounter (Signed)
Referral was done last month. Please forward message to [facility]

## 2015-12-07 NOTE — Telephone Encounter (Signed)
I contacted the pt and advised we had placed the referral to the [facility]monology group and they have been trying to reach her. Pt was given the [facility] pulmonology number and was advised to contact the office to schedule her appointment.

## 2015-12-07 NOTE — Telephone Encounter (Signed)
Pt needs pulmonary appt please advise does she have a referral

## 2015-12-14 DIAGNOSIS — I10 Essential (primary) hypertension: Secondary | ICD-10-CM | POA: Diagnosis not present

## 2015-12-14 DIAGNOSIS — E7800 Pure hypercholesterolemia, unspecified: Secondary | ICD-10-CM | POA: Diagnosis not present

## 2015-12-14 DIAGNOSIS — Z0000 Encounter for general adult medical examination without abnormal findings: Secondary | ICD-10-CM | POA: Diagnosis not present

## 2015-12-14 DIAGNOSIS — D509 Iron deficiency anemia, unspecified: Secondary | ICD-10-CM | POA: Diagnosis not present

## 2016-01-07 ENCOUNTER — Institutional Professional Consult (permissible substitution): Payer: Medicare HMO | Admitting: Pulmonary Disease

## 2016-01-13 ENCOUNTER — Telehealth: Payer: Self-pay | Admitting: Endocrinology

## 2016-01-13 ENCOUNTER — Other Ambulatory Visit: Payer: Self-pay | Admitting: Endocrinology

## 2016-01-13 DIAGNOSIS — K625 Hemorrhage of anus and rectum: Secondary | ICD-10-CM

## 2016-01-13 NOTE — Telephone Encounter (Signed)
Dr Loanne Drilling patient calling to see him today, he is her primary care doctor, (and course he's not here today) Last night she was bleeding from the rectum for about 40 mins non stop. Today is ok, but would like to see Dr Dwyane Dee or Cruzita Lederer  today about this situation, please advise

## 2016-01-13 NOTE — Telephone Encounter (Signed)
I contacted the pt and advised of note below. Pt voiced understanding.  

## 2016-01-13 NOTE — Telephone Encounter (Signed)
Recommend she be seen by gastroenterology, will put an urgent consultation

## 2016-01-13 NOTE — Telephone Encounter (Signed)
I am not doing Primary care >> would Dr Dwyane Dee be OK to see her? If not, she needs to go to [place].

## 2016-01-14 ENCOUNTER — Ambulatory Visit (INDEPENDENT_AMBULATORY_CARE_PROVIDER_SITE_OTHER): Payer: Medicare HMO | Admitting: Gastroenterology

## 2016-01-14 ENCOUNTER — Encounter: Payer: Self-pay | Admitting: Gastroenterology

## 2016-01-14 VITALS — BP 120/70 | HR 84 | Ht 65.0 in | Wt 150.0 lb

## 2016-01-14 DIAGNOSIS — K648 Other hemorrhoids: Secondary | ICD-10-CM

## 2016-01-14 DIAGNOSIS — K625 Hemorrhage of anus and rectum: Secondary | ICD-10-CM

## 2016-01-14 MED ORDER — NA SULFATE-K SULFATE-MG SULF 17.5-3.13-1.6 GM/177ML PO SOLN: ORAL | Status: DC

## 2016-01-14 NOTE — Patient Instructions (Addendum)
You have been scheduled for a colonoscopy. Please follow written instructions given to you at your visit today.  Please pick up your prep supplies at the pharmacy within the next 1-3 days. If you use inhalers (even only as needed), please bring them with you on the day of your procedure. Your physician has requested that you go to www.startemmi.com and enter the access code given to you at your visit today. This web site gives a general overview about your procedure. However, you should still follow specific instructions given to you by our office regarding your preparation for the procedure.  You have been scheduled for hemorrhoidal banding with Dr Havery Moros on 02/16/16 @ 4:00 pm.   If you are age 65 or older, your body mass index should be between 23-30. Your Body mass index is 24.96 kg/(m^2). If this is out of the aforementioned range listed, please consider follow up with your Primary Care Provider.  If you are age 80 or younger, your body mass index should be between 19-25. Your Body mass index is 24.96 kg/(m^2). If this is out of the aformentioned range listed, please consider follow up with your Primary Care Provider.

## 2016-01-14 NOTE — Progress Notes (Signed)
[facility] Consult Note:  History: Autumn Galloway 01/14/2016  Referring physician: Renato Shin, Galloway  Reason for consult/chief complaint: Rectal Bleeding   Subjective HPI:  This patient was evaluated for rectal bleeding. She last saw Dr. Deatra Galloway in January 2014 for rectal bleeding. It was felt likely to be hemorrhoidal in nature, so he offered her several treatments and she opted for suppositories. It is not clear how often she really took them after that. She had just occasional bleeding until last week, when she had one painless episode that lasted over half an hour, and another episode 2 days ago that lasted about 15 minutes. On at least one occasion she she could feel something around the perianal area that she suspected were hemorrhoids. She then pushed them back in. She reports being anemic from chronic kidney disease and had IV iron last month. No outside records her labs are currently available. He denies abdominal pain constipation or diarrhea. Autumn Galloway's last colonoscopy was in August of 2008.  ROS:  Review of Systems  Constitutional: Negative for appetite change and unexpected weight change.  HENT: Negative for mouth sores and voice change.   Eyes: Negative for pain and redness.  Respiratory: Negative for cough and shortness of breath.   Cardiovascular: Negative for chest pain and palpitations.  Genitourinary: Negative for dysuria and hematuria.  Musculoskeletal: Positive for arthralgias. Negative for myalgias.  Skin: Negative for pallor and rash.  Neurological: Negative for weakness and headaches.  Hematological: Negative for adenopathy.   She is hard of hearing  Past Medical History: Past Medical History  Diagnosis Date  . THROMBOCYTOPENIA 12/25/2008  . RENAL INSUFFICIENCY 12/25/2008  . OSTEOPOROSIS 06/13/2010  . INTERNAL HEMORRHOIDS 09/09/2003  . HYPERTENSION 02/28/2007  . HYPERLIPIDEMIA 02/28/2007  . GERD 05/03/1998  . ESOPHAGEAL STRICTURE 05/03/1998  .  DIZZINESS OR VERTIGO 02/28/2007  . DIVERTICULOSIS OF COLON 09/09/2003  . CAROTID ARTERY STENOSIS 06/13/2010  . BREAST CANCER, HX OF 02/28/2007  . Scoliosis   . Apical lung scarring     Chronic  . Vitamin D deficiency   . Hearing loss      Past Surgical History: Past Surgical History  Procedure Laterality Date  . Tubal ligation  1989  . Mastectomy  09/2003    left  . US echocardiography  05/03/2004  . Electrocardiogram  05/02/2005  . Carpal tunnel release      bilateral  . Tonsillectomy       Family History: Family History  Problem Relation Age of Onset  . Breast cancer Sister     x 2  . Breast cancer Maternal Aunt   . Breast cancer Other     nephew    Social History: Social History   Social History  . Marital Status: Widowed    Spouse Name: N/A  . Number of Children: 4  . Years of Education: N/A   Occupational History  . Retired    Social History Main Topics  . Smoking status: Never Smoker   . Smokeless tobacco: Never Used  . Alcohol Use: No  . Drug Use: No  . Sexual Activity: Not Asked   Other Topics Concern  . None   Social History Narrative    Allergies: Allergies  Allergen Reactions  . Lisinopril     REACTION: Cough    Outpatient Meds: Current Outpatient Prescriptions  Medication Sig Dispense Refill  . aspirin 81 MG tablet Take 81 mg by mouth daily.      . Calcium Carbonate (CALTRATE 600 PO)  Take by mouth.    . losartan (COZAAR) 25 MG tablet Take 1 tablet (25 mg total) by mouth daily. 90 tablet 1  . metoprolol succinate (TOPROL XL) 25 MG 24 hr tablet Take 0.5 tablets (12.5 mg total) by mouth daily. 15 tablet 11  . Multiple Vitamins-Minerals (CENTRUM SILVER PO) Take 1 tablet by mouth daily.      . simvastatin (ZOCOR) 20 MG tablet TAKE ONE TABLET BY MOUTH AT BEDTIME 90 tablet 0  . Na Sulfate-K Sulfate-Mg Sulf 17.5-3.13-1.6 GM/180ML SOLN Suprep-Use as directed 354 mL 0   No current facility-administered medications for this visit.       ___________________________________________________________________ Objective  Exam:  BP 120/70 mmHg  Pulse 84  Ht 5\' 5"  (1.651 m)  Wt 150 lb (68.04 kg)  BMI 24.96 kg/m2   General: this is a(n) Well-appearing elderly woman, somewhat hard of hearing   Eyes: sclera anicteric, no redness  ENT: oral mucosa moist without lesions, no cervical or supraclavicular lymphadenopathy, good dentition  CV: RRR without murmur, S1/S2, no JVD, no peripheral edema  Resp: clear to auscultation bilaterally, normal RR and effort noted  GI: soft, no tenderness, with active bowel sounds. No guarding or palpable organomegaly noted.  Skin; warm and dry, no rash or jaundice noted  Neuro: awake, alert and oriented x 3. Normal gross motor function and fluent speech Rectal exam and anoscopy chaperoned by our MA Autumn Galloway: Both right anterior and right posterior internal hemorrhoids without stigmata of bleeding, the left hemorrhoidal plexus was not well visualized. Labs:  Cbc normal in July 2016  Assessment: Encounter Diagnoses  Name Primary?  . Rectal bleeding Yes  . Internal hemorrhoids     I think this is most likely hemorrhoidal bleeding, however it has been nearly 9 years since her last colonoscopy.  Plan:  Colonoscopy next week.  The benefits and risks of the planned procedure were described in detail with the patient or (when appropriate) their health care proxy.  Risks were outlined as including, but not limited to, bleeding, infection, perforation, adverse medication reaction leading to cardiac or pulmonary decompensation, or pancreatitis (if ERCP).  The limitation of incomplete mucosal visualization was also discussed.  No guarantees or warranties were given.  If negative, I will set her up for hemorrhoidal banding with one of my partners shortly after that.  Thank you for the courtesy of this consult.  Please call me with any questions or concerns.  Autumn Galloway  CC:  Autumn Galloway

## 2016-01-17 ENCOUNTER — Encounter: Payer: Self-pay | Admitting: Gastroenterology

## 2016-01-17 ENCOUNTER — Ambulatory Visit (AMBULATORY_SURGERY_CENTER): Payer: Medicare HMO | Admitting: Gastroenterology

## 2016-01-17 VITALS — BP 125/62 | HR 59 | Temp 98.4°F | Resp 18 | Ht 65.0 in | Wt 150.0 lb

## 2016-01-17 DIAGNOSIS — K641 Second degree hemorrhoids: Secondary | ICD-10-CM

## 2016-01-17 DIAGNOSIS — K625 Hemorrhage of anus and rectum: Secondary | ICD-10-CM

## 2016-01-17 DIAGNOSIS — K648 Other hemorrhoids: Secondary | ICD-10-CM | POA: Diagnosis not present

## 2016-01-17 DIAGNOSIS — I10 Essential (primary) hypertension: Secondary | ICD-10-CM | POA: Diagnosis not present

## 2016-01-17 DIAGNOSIS — K219 Gastro-esophageal reflux disease without esophagitis: Secondary | ICD-10-CM | POA: Diagnosis not present

## 2016-01-17 MED ORDER — SODIUM CHLORIDE 0.9 % IV SOLN: 500.0000 mL | INTRAVENOUS | Status: DC

## 2016-01-17 NOTE — Op Note (Signed)
[facility] Patient Name: Autumn Galloway Procedure Date: 01/17/2016 9:58 AM MRN: [id number] Endoscopist: [place]. Loletha Carrow , MD Age: 82 Referring MD:  Date of Birth: 04-26-1931 Gender: Female Account #: [id number] Procedure:                Colonoscopy Indications:              Rectal bleeding Medicines:                Monitored Anesthesia Care Procedure:                Pre-Anesthesia Assessment:                           - Prior to the procedure, a History and Physical                            was performed, and patient medications and                            allergies were reviewed. The patient's tolerance of                            previous anesthesia was also reviewed. The risks                            and benefits of the procedure and the sedation                            options and risks were discussed with the patient.                            All questions were answered, and informed consent                            was obtained. Prior Anticoagulants: The patient has                            taken no previous anticoagulant or antiplatelet                            agents. ASA Grade Assessment: II - A patient with                            mild systemic disease. After reviewing the risks                            and benefits, the patient was deemed in                            satisfactory condition to undergo the procedure.                           After obtaining informed consent, the colonoscope  was passed under direct vision. Throughout the                            procedure, the patient's blood pressure, pulse, and                            oxygen saturations were monitored continuously. The                            Model PCF-H190L [id number]) scope was introduced                            through the anus and advanced to the the cecum,                            identified by appendiceal orifice and ileocecal                             valve. The colonoscopy was performed without                            difficulty. The patient tolerated the procedure                            well. The quality of the bowel preparation was                            excellent. The ileocecal valve, appendiceal                            orifice, and rectum were photographed. The bowel                            preparation used was Miralax. Scope In: 10:09:50 AM Scope Out: 10:21:21 AM Scope Withdrawal Time: 0 hours 8 minutes 7 seconds  Total Procedure Duration: 0 hours 11 minutes 31 seconds  Findings:                 The digital rectal exam findings include internal                            hemorrhoids that prolapse with straining, but                            spontaneously regress to the resting position                            (Grade II).                           Internal hemorrhoids were found during digital                            exam. The hemorrhoids were Grade II (internal  hemorrhoids that prolapse but reduce spontaneously).                           Multiple diverticula were found in the left colon.                           The exam was otherwise without abnormality. Complications:            No immediate complications. Estimated Blood Loss:     Estimated blood loss: none. Impression:               - Internal hemorrhoids that prolapse with                            straining, but spontaneously regress to the resting                            position (Grade II) found on digital rectal exam.                           - Diverticulosis in the left colon.                           - The examination was otherwise normal.                           - No specimens collected. Recommendation:           - Patient has a contact number available for                            emergencies. The signs and symptoms of potential                            delayed  complications were discussed with the                            patient. Return to normal activities tomorrow.                            Written discharge instructions were provided to the                            patient.                           - Resume previous diet.                           - Continue present medications.                           - No recommendation at this time regarding repeat                            colonoscopy due to age.                           -  Return to GI office as schedule for hemorrhoid                            banding.                           - Preparation H suppository: Insert rectally twice                            daily when bleeding occurs. Henry L. Loletha Carrow, MD 01/17/2016 10:31:43 AM This report has been signed electronically.

## 2016-01-17 NOTE — Progress Notes (Signed)
Stable to RR 

## 2016-01-17 NOTE — Patient Instructions (Signed)
YOU HAD AN ENDOSCOPIC PROCEDURE TODAY AT [facility]:   Refer to the procedure report that was given to you for any specific questions about what was found during the examination.  If the procedure report does not answer your questions, please call your gastroenterologist to clarify.  If you requested that your care partner not be given the details of your procedure findings, then the procedure report has been included in a sealed envelope for you to review at your convenience later.  YOU SHOULD EXPECT: Some feelings of bloating in the abdomen. Passage of more gas than usual.  Walking can help get rid of the air that was put into your GI tract during the procedure and reduce the bloating. If you had a lower endoscopy (such as a colonoscopy or flexible sigmoidoscopy) you may notice spotting of blood in your stool or on the toilet paper. If you underwent a bowel prep for your procedure, you may not have a normal bowel movement for a few days.  Please Note:  You might notice some irritation and congestion in your nose or some drainage.  This is from the oxygen used during your procedure.  There is no need for concern and it should clear up in a day or so.  SYMPTOMS TO REPORT IMMEDIATELY:   Following lower endoscopy (colonoscopy or flexible sigmoidoscopy):  Excessive amounts of blood in the stool  Significant tenderness or worsening of abdominal pains  Swelling of the abdomen that is new, acute  Fever of 100F or higher  F    For urgent or emergent issues, a gastroenterologist can be reached at any hour by calling [phone].   DIET: Your first meal following the procedure should be a small meal and then it is ok to progress to your normal diet. Heavy or fried foods are harder to digest and may make you feel nauseous or bloated.  Likewise, meals heavy in dairy and vegetables can increase bloating.  Drink plenty of fluids but you should avoid alcoholic beverages for 24  hours.  ACTIVITY:  You should plan to take it easy for the rest of today and you should NOT DRIVE or use heavy machinery until tomorrow (because of the sedation medicines used during the test).    FOLLOW UP: Our staff will call the number listed on your records the next business day following your procedure to check on you and address any questions or concerns that you may have regarding the information given to you following your procedure. If we do not reach you, we will leave a message.  However, if you are feeling well and you are not experiencing any problems, there is no need to return our call.  We will assume that you have returned to your regular daily activities without incident.  Return to office for sheduled hemmorhoid banding  Diverticulosis and high fiber information given.   If any biopsies were taken you will be contacted by phone or by letter within the next 1-3 weeks.  Please call us at [phone] if you have not heard about the biopsies in 3 weeks.    SIGNATURES/CONFIDENTIALITY: You and/or your care partner have signed paperwork which will be entered into your electronic medical record.  These signatures attest to the fact that that the information above on your After Visit Summary has been reviewed and is understood.  Full responsibility of the confidentiality of this discharge information lies with you and/or your care-partner.

## 2016-01-18 ENCOUNTER — Telehealth: Payer: Self-pay | Admitting: *Deleted

## 2016-01-18 NOTE — Telephone Encounter (Signed)
  Follow up Call-  Call back number 01/17/2016  Post procedure Call Back phone  # [phone]  Permission to leave phone message Yes     Patient questions:  Do you have a fever, pain , or abdominal swelling? No. Pain Score  0 *  Have you tolerated food without any problems? Yes.    Have you been able to return to your normal activities? Yes.    Do you have any questions about your discharge instructions: Diet   No. Medications  No. Follow up visit  No.  Do you have questions or concerns about your Care? No.  Actions: * If pain score is 4 or above: No action needed, pain <4.

## 2016-01-31 ENCOUNTER — Ambulatory Visit (INDEPENDENT_AMBULATORY_CARE_PROVIDER_SITE_OTHER): Payer: Medicare HMO | Admitting: Pulmonary Disease

## 2016-01-31 ENCOUNTER — Encounter: Payer: Self-pay | Admitting: Pulmonary Disease

## 2016-01-31 VITALS — BP 122/80 | HR 79 | Ht 65.0 in | Wt 152.4 lb

## 2016-01-31 DIAGNOSIS — J984 Other disorders of lung: Secondary | ICD-10-CM

## 2016-01-31 NOTE — Assessment & Plan Note (Signed)
Dyspnea on exertion of 1 year duration: Plan: We will schedule you for pulmonary functions tests. Follow up appointment with Dr. Vaughan Browner after the PFT's. Please contact office for sooner follow up if symptoms do not improve or worsen or seek emergency care

## 2016-01-31 NOTE — Progress Notes (Signed)
History of Present Illness Autumn Galloway is a 82 y.o. female never smoker here for consult for dyspnea on exertion  For 1 year duration.   7/10/2017Consultation for Dyspnea: HPI: Pt. Presents to the office today with the complaint of heavy breathing when she walks.greater than one block.She states she has had this for about 1 year.Severity is moderate with exertion. It resolves within 5 minutes after sitting down, or resting.She states she has only had wheezing x 1 with these episodes.She also has fatigue with the shortness of breath.She states that the fatigue is more of a chronic problem. She also has chronic anemia.She did have breast cancer 10 years ago and has had left mastectomy. She is no longer under surveillance for her cancer.She has not had any new exposures.There are no birds or animals in the house.She does note new onset left foot  edema.She denies fever, chest pain, purulent secretions,orthopnea, or hemoptysis.No family history of lung disease. She does have a hiatal hernia, and scoliosis.  Tests FINDINGS: 10/29/2015: No cardiomegaly. Hiatal hernia. Stable aortic and hilar contours.  Chronic hyperinflation and interstitial coarsening. Symmetric biapical pleural thickening. There is no edema, consolidation, effusion, or pneumothorax. Advanced S shaped scoliosis.  IMPRESSION: Stable from prior. No evidence of active disease.  Hiatal hernia.     Family History  Problem Relation Age of Onset  . Breast cancer Sister     x 2  . Breast cancer Maternal Aunt   . Breast cancer Other     nephew    Social History   Social History  . Marital Status: Widowed    Spouse Name: N/A  . Number of Children: 4  . Years of Education: N/A   Occupational History  . Retired    Social History Main Topics  . Smoking status: Never Smoker   . Smokeless tobacco: Never Used  . Alcohol Use: No  . Drug Use: No  . Sexual Activity: Not Asked   Other Topics Concern  . None    Social History Narrative   Past medical hx Past Medical History  Diagnosis Date  . THROMBOCYTOPENIA 12/25/2008  . RENAL INSUFFICIENCY 12/25/2008  . OSTEOPOROSIS 06/13/2010  . INTERNAL HEMORRHOIDS 09/09/2003  . HYPERTENSION 02/28/2007  . HYPERLIPIDEMIA 02/28/2007  . GERD 05/03/1998  . ESOPHAGEAL STRICTURE 05/03/1998  . DIZZINESS OR VERTIGO 02/28/2007  . DIVERTICULOSIS OF COLON 09/09/2003  . CAROTID ARTERY STENOSIS 06/13/2010  . BREAST CANCER, HX OF 02/28/2007  . Scoliosis   . Apical lung scarring     Chronic  . Vitamin D deficiency   . Hearing loss      Past surgical hx, Family hx, Social hx all reviewed.  Current Outpatient Prescriptions on File Prior to Visit  Medication Sig  . aspirin 81 MG tablet Take 81 mg by mouth daily.    . Calcium Carbonate (CALTRATE 600 PO) Take by mouth.  . losartan (COZAAR) 25 MG tablet Take 1 tablet (25 mg total) by mouth daily.  . metoprolol succinate (TOPROL XL) 25 MG 24 hr tablet Take 0.5 tablets (12.5 mg total) by mouth daily.  . Multiple Vitamins-Minerals (CENTRUM SILVER PO) Take 1 tablet by mouth daily.    . simvastatin (ZOCOR) 20 MG tablet TAKE ONE TABLET BY MOUTH AT BEDTIME   No current facility-administered medications on file prior to visit.     Allergies  Allergen Reactions  . Lisinopril     REACTION: Cough    Current outpatient prescriptions:  .  aspirin 81 MG tablet,  Take 81 mg by mouth daily.  , Disp: , Rfl:  .  Calcium Carbonate (CALTRATE 600 PO), Take by mouth., Disp: , Rfl:  .  losartan (COZAAR) 25 MG tablet, Take 1 tablet (25 mg total) by mouth daily., Disp: 90 tablet, Rfl: 1 .  metoprolol succinate (TOPROL XL) 25 MG 24 hr tablet, Take 0.5 tablets (12.5 mg total) by mouth daily., Disp: 15 tablet, Rfl: 11 .  Multiple Vitamins-Minerals (CENTRUM SILVER PO), Take 1 tablet by mouth daily.  , Disp: , Rfl:  .  simvastatin (ZOCOR) 20 MG tablet, TAKE ONE TABLET BY MOUTH AT BEDTIME, Disp: 90 tablet, Rfl: 0 Review Of  Systems:  Constitutional:   No  weight loss, night sweats,  Fevers, chills, fatigue, or  lassitude.  HEENT:   No headaches,  Difficulty swallowing,  Tooth/dental problems, or  Sore throat,                No sneezing, itching, ear ache, nasal congestion, post nasal drip,   CV:  No chest pain,  Orthopnea, PND, swelling in lower extremities, anasarca, dizziness, palpitations, syncope.   GI  No heartburn, indigestion, abdominal pain, nausea, vomiting, diarrhea, change in bowel habits, loss of appetite, bloody stools.   Resp: + shortness of breath just with exertion less at rest.  No excess mucus, no productive cough,  No non-productive cough,  No coughing up of blood.  No change in color of mucus.  No wheezing.  No chest wall deformity  Skin: no rash or lesions.  GU: no dysuria, change in color of urine, no urgency or frequency.  No flank pain, no hematuria   MS:  No joint pain or swelling.  No decreased range of motion.  No back pain.  Psych:  No change in mood or affect. No depression or anxiety.  No memory loss.   Vital Signs BP 122/80 mmHg  Pulse 79  Ht 5\' 5"  (1.651 m)  Wt 152 lb 6.4 oz (69.128 kg)  BMI 25.36 kg/m2  SpO2 96%   Physical Exam:  General- No distress,  A&Ox3 ENT: No sinus tenderness, TM clear, pale nasal mucosa, no oral exudate,no post nasal drip, no LAN Cardiac: S1, S2, regular rate and rhythm, no murmur Chest: No wheeze/ rales/ dullness; no accessory muscle use, no nasal flaring, no sternal retractions Abd.: Soft Non-tender Ext: No clubbing cyanosis, trace edema left foot. Neuro:  normal strength Skin: No rashes, warm and dry Psych: normal mood and behavior   Assessment/Plan  Restrictive lung disease Dyspnea on exertion of 1 year duration: Plan: We will schedule you for pulmonary functions tests. Follow up appointment with Dr. Vaughan Browner after the PFT's. Please contact office for sooner follow up if symptoms do not improve or worsen or seek emergency care       [name], NP 01/31/2016  3:21 PM   Attending note: I have seen and examined the patient with nurse practitioner/resident and agree with the note. History, labs and imaging reviewed.  [name] has been referred for evaluation of dyspnea, restrictive process on PFTs. She feels that her symptoms are manageable and does not need inhalers. I suspect that her restriction is secondary to the hiatal hernia and scoliosis. She is a never smoker and has no evidence of ILD, exposures.   We will evaluate with full set of PFTs. Return in 1 month to review results and further work up, therapy if needed.  Marshell Garfinkel MD [facility] Pager [phone]  [phone] If no answer or after 3pm call: [phone] 01/31/2016, 4:29 PM

## 2016-01-31 NOTE — Patient Instructions (Addendum)
It is nice to meet you  We will schedule you for pulmonary functions tests. Follow up appointment with Dr. Vaughan Browner after the PFT's. Please contact office for sooner follow up if symptoms do not improve or worsen or seek emergency care

## 2016-02-11 ENCOUNTER — Encounter: Payer: Self-pay | Admitting: *Deleted

## 2016-02-16 ENCOUNTER — Encounter: Payer: Self-pay | Admitting: Gastroenterology

## 2016-02-16 ENCOUNTER — Ambulatory Visit (INDEPENDENT_AMBULATORY_CARE_PROVIDER_SITE_OTHER): Payer: Medicare HMO | Admitting: Gastroenterology

## 2016-02-16 VITALS — BP 120/70 | Ht 65.0 in | Wt 152.0 lb

## 2016-02-16 DIAGNOSIS — K641 Second degree hemorrhoids: Secondary | ICD-10-CM

## 2016-02-16 NOTE — Progress Notes (Signed)
PROCEDURE NOTE: The patient presents with symptomatic grade II  hemorrhoids, requesting rubber band ligation of his/her hemorrhoidal disease.  All risks, benefits and alternative forms of therapy were described and informed consent was obtained.  In the Left Lateral Decubitus position anoscopic examination revealed grade II hemorrhoids in the all position(s), largest / most inflamed in RP and LL area.  The anorectum was pre-medicated with 0.125% nitroglycerin The decision was made to band the RP internal hemorrhoid, and the [place] was used to perform band ligation without complication.  Digital anorectal examination was then performed to assure proper positioning of the band, and to adjust the banded tissue as required.  The patient was discharged home without pain or other issues.  Dietary and behavioral recommendations were given and along with follow-up instructions.     The following adjunctive treatments were recommended: Daily fiber supplement  The patient will return in 3-4 weeks for  follow-up and possible additional banding as required. No complications were encountered and the patient tolerated the procedure well.  Lares Cellar, MD [facility] Pager [phone]

## 2016-02-16 NOTE — Patient Instructions (Signed)
Please keep your scheduled 2nd hemorrhoidal banding.  HEMORRHOID BANDING PROCEDURE    FOLLOW-UP CARE   1. The procedure you have had should have been relatively painless since the banding of the area involved does not have nerve endings and there is no pain sensation.  The rubber band cuts off the blood supply to the hemorrhoid and the band may fall off as soon as 48 hours after the banding (the band may occasionally be seen in the toilet bowl following a bowel movement). You may notice a temporary feeling of fullness in the rectum which should respond adequately to plain Tylenol or Motrin.  2. Following the banding, avoid strenuous exercise that evening and resume full activity the next day.  A sitz bath (soaking in a warm tub) or bidet is soothing, and can be useful for cleansing the area after bowel movements.     3. To avoid constipation, take two tablespoons of natural wheat bran, natural oat bran, flax, Benefiber or any over the counter fiber supplement and increase your water intake to 7-8 glasses daily.    4. Unless you have been prescribed anorectal medication, do not put anything inside your rectum for two weeks: No suppositories, enemas, fingers, etc.  5. Occasionally, you may have more bleeding than usual after the banding procedure.  This is often from the untreated hemorrhoids rather than the treated one.  Don't be concerned if there is a tablespoon or so of blood.  If there is more blood than this, lie flat with your bottom higher than your head and apply an ice pack to the area. If the bleeding does not stop within a half an hour or if you feel faint, call our office at [phone] or go to the emergency room.  6. Problems are not common; however, if there is a substantial amount of bleeding, severe pain, chills, fever or difficulty passing urine (very rare) or other problems, you should call us at [phone] or report to the nearest emergency room.  7. Do not stay  seated continuously for more than 2-3 hours for a day or two after the procedure.  Tighten your buttock muscles 10-15 times every two hours and take 10-15 deep breaths every 1-2 hours.  Do not spend more than a few minutes on the toilet if you cannot empty your bowel; instead re-visit the toilet at a later time.    If you are age 28 or older, your body mass index should be between 23-30. Your Body mass index is 25.29 kg/m. If this is out of the aforementioned range listed, please consider follow up with your Primary Care Provider.  If you are age 80 or younger, your body mass index should be between 19-25. Your Body mass index is 25.29 kg/m. If this is out of the aformentioned range listed, please consider follow up with your Primary Care Provider.

## 2016-02-17 ENCOUNTER — Ambulatory Visit (INDEPENDENT_AMBULATORY_CARE_PROVIDER_SITE_OTHER): Payer: Medicare HMO | Admitting: Endocrinology

## 2016-02-17 ENCOUNTER — Encounter: Payer: Self-pay | Admitting: Endocrinology

## 2016-02-17 VITALS — BP 120/80 | HR 64 | Wt 151.8 lb

## 2016-02-17 DIAGNOSIS — E785 Hyperlipidemia, unspecified: Secondary | ICD-10-CM | POA: Diagnosis not present

## 2016-02-17 DIAGNOSIS — M810 Age-related osteoporosis without current pathological fracture: Secondary | ICD-10-CM

## 2016-02-17 DIAGNOSIS — N259 Disorder resulting from impaired renal tubular function, unspecified: Secondary | ICD-10-CM | POA: Diagnosis not present

## 2016-02-17 DIAGNOSIS — I10 Essential (primary) hypertension: Secondary | ICD-10-CM | POA: Diagnosis not present

## 2016-02-17 DIAGNOSIS — Z0189 Encounter for other specified special examinations: Secondary | ICD-10-CM | POA: Diagnosis not present

## 2016-02-17 DIAGNOSIS — D509 Iron deficiency anemia, unspecified: Secondary | ICD-10-CM

## 2016-02-17 DIAGNOSIS — R413 Other amnesia: Secondary | ICD-10-CM

## 2016-02-17 DIAGNOSIS — Z0000 Encounter for general adult medical examination without abnormal findings: Secondary | ICD-10-CM

## 2016-02-17 LAB — URINALYSIS, ROUTINE W REFLEX MICROSCOPIC
BILIRUBIN URINE: NEGATIVE
Hgb urine dipstick: NEGATIVE
Ketones, ur: NEGATIVE
Leukocytes, UA: NEGATIVE
NITRITE: NEGATIVE
PH: 6.5 (ref 5.0–8.0)
SPECIFIC GRAVITY, URINE: 1.02 (ref 1.000–1.030)
Total Protein, Urine: NEGATIVE
Urine Glucose: NEGATIVE
Urobilinogen, UA: 0.2 (ref 0.0–1.0)

## 2016-02-17 LAB — CBC WITH DIFFERENTIAL/PLATELET
Basophils Absolute: 0.1 10*3/uL (ref 0.0–0.1)
Basophils Relative: 0.7 % (ref 0.0–3.0)
EOS PCT: 7.9 % — AB (ref 0.0–5.0)
Eosinophils Absolute: 0.7 10*3/uL (ref 0.0–0.7)
HCT: 36.2 % (ref 36.0–46.0)
Hemoglobin: 12.1 g/dL (ref 12.0–15.0)
LYMPHS ABS: 1.5 10*3/uL (ref 0.7–4.0)
Lymphocytes Relative: 17.2 % (ref 12.0–46.0)
MCHC: 33.5 g/dL (ref 30.0–36.0)
MCV: 83.1 fl (ref 78.0–100.0)
MONO ABS: 0.5 10*3/uL (ref 0.1–1.0)
MONOS PCT: 5.7 % (ref 3.0–12.0)
NEUTROS ABS: 6 10*3/uL (ref 1.4–7.7)
NEUTROS PCT: 68.5 % (ref 43.0–77.0)
PLATELETS: 304 10*3/uL (ref 150.0–400.0)
RBC: 4.36 Mil/uL (ref 3.87–5.11)
RDW: 20.3 % — AB (ref 11.5–15.5)
WBC: 8.8 10*3/uL (ref 4.0–10.5)

## 2016-02-17 LAB — HEPATIC FUNCTION PANEL
ALK PHOS: 69 U/L (ref 39–117)
ALT: 23 U/L (ref 0–35)
AST: 28 U/L (ref 0–37)
Albumin: 3.7 g/dL (ref 3.5–5.2)
BILIRUBIN DIRECT: 0 mg/dL (ref 0.0–0.3)
Total Bilirubin: 0.4 mg/dL (ref 0.2–1.2)
Total Protein: 6.8 g/dL (ref 6.0–8.3)

## 2016-02-17 LAB — BASIC METABOLIC PANEL
BUN: 20 mg/dL (ref 6–23)
CALCIUM: 9.4 mg/dL (ref 8.4–10.5)
CHLORIDE: 101 meq/L (ref 96–112)
CO2: 32 mEq/L (ref 19–32)
CREATININE: 1.16 mg/dL (ref 0.40–1.20)
GFR: 47.44 mL/min — ABNORMAL LOW (ref >60.00)
Glucose, Bld: 87 mg/dL (ref 70–99)
Potassium: 4.2 mEq/L (ref 3.5–5.1)
Sodium: 138 mEq/L (ref 135–145)

## 2016-02-17 LAB — LIPID PANEL
CHOL/HDL RATIO: 3
Cholesterol: 148 mg/dL (ref 0–200)
HDL: 50.9 mg/dL (ref >39.00)
LDL Cholesterol: 69 mg/dL (ref 0–99)
NonHDL: 96.9
TRIGLYCERIDES: 141 mg/dL (ref 0.0–149.0)
VLDL: 28.2 mg/dL (ref 0.0–40.0)

## 2016-02-17 LAB — VITAMIN B12: Vitamin B-12: 273 pg/mL (ref 211–911)

## 2016-02-17 LAB — TSH: TSH: 2.33 u[IU]/mL (ref 0.35–4.50)

## 2016-02-17 NOTE — Patient Instructions (Addendum)
Please consider these measures for your health:  minimize alcohol.  Do not use tobacco products.  Have a colonoscopy at least every 10 years from age 80.  Women should have an annual mammogram from age 40.  Keep firearms safely stored.  Always use seat belts.  have working smoke alarms in your home.  See an eye doctor and dentist regularly.  Never drive under the influence of alcohol or drugs (including prescription drugs).  Those with fair skin should take precautions against the sun, and should carefully examine their skin once per month, for any new or changed moles. It is critically important to prevent falling down (keep floor areas well-lit, dry, and free of loose objects.  If you have a cane, walker, or wheelchair, you should use it, even for short trips around the house.  Wear flat-soled shoes.  Also, try not to rush).  blood tests are requested for you today.  We'll let you know about the results.  Please come back for a follow-up appointment in 6 months.   

## 2016-02-17 NOTE — Progress Notes (Signed)
Subjective:    Patient ID: Autumn Galloway, female    DOB: 1930/12/25, 82 y.o.   MRN: [id number]  HPI Pt is here for regular wellness examination, and is feeling pretty well in general, and says chronic med probs are stable, except as noted below Past Medical History:  Diagnosis Date  . Apical lung scarring    Chronic  . BREAST CANCER, HX OF 02/28/2007  . CAROTID ARTERY STENOSIS 06/13/2010  . DIVERTICULOSIS OF COLON 09/09/2003  . DIZZINESS OR VERTIGO 02/28/2007  . ESOPHAGEAL STRICTURE 05/03/1998  . GERD 05/03/1998  . Hearing loss   . HYPERLIPIDEMIA 02/28/2007  . HYPERTENSION 02/28/2007  . INTERNAL HEMORRHOIDS 09/09/2003  . Internal hemorrhoids   . OSTEOPOROSIS 06/13/2010  . RENAL INSUFFICIENCY 12/25/2008  . Scoliosis   . THROMBOCYTOPENIA 12/25/2008  . Vitamin D deficiency     Past Surgical History:  Procedure Laterality Date  . CARPAL TUNNEL RELEASE     bilateral  . ELECTROCARDIOGRAM  05/02/2005  . MASTECTOMY  09/2003   left  . TONSILLECTOMY    . TUBAL LIGATION  1989  . US ECHOCARDIOGRAPHY  05/03/2004    Social History   Social History  . Marital status: Widowed    Spouse name: N/A  . Number of children: 4  . Years of education: N/A   Occupational History  . Retired    Social History Main Topics  . Smoking status: Never Smoker  . Smokeless tobacco: Never Used  . Alcohol use No  . Drug use: No  . Sexual activity: Not on file   Other Topics Concern  . Not on file   Social History Narrative  . No narrative on file    Current Outpatient Prescriptions on File Prior to Visit  Medication Sig Dispense Refill  . aspirin 81 MG tablet Take 81 mg by mouth daily.      . Calcium Carbonate (CALTRATE 600 PO) Take by mouth.    . losartan (COZAAR) 25 MG tablet Take 1 tablet (25 mg total) by mouth daily. 90 tablet 1  . metoprolol succinate (TOPROL XL) 25 MG 24 hr tablet Take 0.5 tablets (12.5 mg total) by mouth daily. 15 tablet 11  . Multiple Vitamins-Minerals (CENTRUM SILVER PO)  Take 1 tablet by mouth daily.      . simvastatin (ZOCOR) 20 MG tablet TAKE ONE TABLET BY MOUTH AT BEDTIME 90 tablet 0   No current facility-administered medications on file prior to visit.     Allergies  Allergen Reactions  . Lisinopril     REACTION: Cough    Family History  Problem Relation Age of Onset  . Breast cancer Sister     x 2  . Breast cancer Maternal Aunt   . Breast cancer Other     nephew    BP 120/80   Pulse 64   Wt 151 lb 12.8 oz (68.9 kg)   SpO2 94%   BMI 25.26 kg/m   Review of Systems  Constitutional: Negative for fever and unexpected weight change.  HENT:       No change in chronic hearing loss  Eyes: Negative for visual disturbance.  Respiratory: Negative for shortness of breath.   Cardiovascular: Negative for chest pain.  Gastrointestinal: Negative for diarrhea.  Endocrine: Negative for cold intolerance.  Genitourinary: Negative for hematuria.  Musculoskeletal: Negative for back pain.  Skin: Negative for rash.  Allergic/Immunologic: Negative for environmental allergies.  Neurological: Negative for numbness.  Hematological: Does not bruise/bleed easily.  Psychiatric/Behavioral: Negative  for dysphoric mood.       Objective:   Physical Exam VS: see vs page GEN: no distress HEAD: head: no deformity eyes: no periorbital swelling, no proptosis external nose and ears are normal mouth: no lesion seen NECK: supple, thyroid is not enlarged CHEST WALL: no deformity LUNGS: clear to auscultation CV: reg rate and rhythm, no murmur ABD: abdomen is soft, nontender.  no hepatosplenomegaly.  not distended.  no hernia MUSCULOSKELETAL: muscle bulk and strength are grossly normal.  no obvious joint swelling.  gait is normal and steady EXTEMITIES: no deformity.  no ulcer on the feet.  feet are of normal color and temp.  no edema PULSES: dorsalis pedis intact bilat.  no carotid bruit NEURO:  cn 2-12 grossly intact.   readily moves all 4's.  sensation is  intact to touch on the feet SKIN:  Normal texture and temperature.  No rash or suspicious lesion is visible.   NODES:  None palpable at the neck PSYCH: alert, well-oriented.  Does not appear anxious nor depressed.         Assessment & Plan:  Wellness visit today, with problems stable, except as noted.    Patient is advised the following: Patient Instructions  Please consider these measures for your health:  minimize alcohol.  Do not use tobacco products.  Have a colonoscopy at least every 10 years from age 22.  Women should have an annual mammogram from age 71.  Keep firearms safely stored.  Always use seat belts.  have working smoke alarms in your home.  See an eye doctor and dentist regularly.  Never drive under the influence of alcohol or drugs (including prescription drugs).  Those with fair skin should take precautions against the sun, and should carefully examine their skin once per month, for any new or changed moles. It is critically important to prevent falling down (keep floor areas well-lit, dry, and free of loose objects.  If you have a cane, walker, or wheelchair, you should use it, even for short trips around the house.  Wear flat-soled shoes.  Also, try not to rush). blood tests are requested for you today.  We'll let you know about the results.   Please come back for a follow-up appointment in 6 months.   Renato Shin, MD

## 2016-02-18 LAB — PTH, INTACT AND CALCIUM
CALCIUM: 9.2 mg/dL (ref 8.4–10.5)
PTH: 36 pg/mL (ref 14–64)

## 2016-02-20 NOTE — Progress Notes (Signed)
we discussed code status.  pt requests DNR 

## 2016-03-01 ENCOUNTER — Encounter: Payer: Self-pay | Admitting: Gastroenterology

## 2016-03-01 ENCOUNTER — Ambulatory Visit (INDEPENDENT_AMBULATORY_CARE_PROVIDER_SITE_OTHER): Payer: Medicare HMO | Admitting: Gastroenterology

## 2016-03-01 VITALS — BP 132/74 | HR 64 | Ht 65.0 in | Wt 149.6 lb

## 2016-03-01 DIAGNOSIS — K641 Second degree hemorrhoids: Secondary | ICD-10-CM | POA: Diagnosis not present

## 2016-03-01 NOTE — Progress Notes (Signed)
PROCEDURE NOTE: The patient presents with symptomatic grade II  hemorrhoids, requesting rubber band ligation of his/her hemorrhoidal disease.  All risks, benefits and alternative forms of therapy were described and informed consent was obtained.  The anorectum was pre-medicated with 0.125% nitroglycerin The decision was made to band the LL internal hemorrhoid, and the [place] was used to perform band ligation without complication. Digital anorectal examination was then performed to assure proper positioning of the band, and to adjust the banded tissue as required. The patient was discharged home without pain or other issues.  Dietary and behavioral recommendations were given and along with follow-up instructions.     The following adjunctive treatments were recommended: Daily fiber supplement  The patient will return in 2-4 weeks for  follow-up and possible additional banding as required. No complications were encountered and the patient tolerated the procedure well.  Dunnigan Cellar, MD [facility] Pager [phone]

## 2016-03-01 NOTE — Patient Instructions (Signed)

## 2016-03-03 DIAGNOSIS — R69 Illness, unspecified: Secondary | ICD-10-CM | POA: Diagnosis not present

## 2016-03-06 ENCOUNTER — Ambulatory Visit: Payer: Medicare HMO | Admitting: Endocrinology

## 2016-03-21 ENCOUNTER — Ambulatory Visit (INDEPENDENT_AMBULATORY_CARE_PROVIDER_SITE_OTHER): Payer: Medicare HMO | Admitting: Gastroenterology

## 2016-03-21 ENCOUNTER — Encounter: Payer: Self-pay | Admitting: Gastroenterology

## 2016-03-21 VITALS — BP 120/64 | HR 74 | Ht 65.0 in | Wt 154.0 lb

## 2016-03-21 DIAGNOSIS — K641 Second degree hemorrhoids: Secondary | ICD-10-CM

## 2016-03-21 NOTE — Progress Notes (Signed)
PROCEDURE NOTE: The patient presents with symptomatic grade II  hemorrhoids, requesting rubber band ligation of his/her hemorrhoidal disease.  All risks, benefits and alternative forms of therapy were described and informed consent was obtained.  The anorectum was pre-medicated with 0.125% nitroglycerin The decision was made to band the RA internal hemorrhoid, and the [place] was used to perform band ligation without complication.  Digital anorectal examination was then performed to assure proper positioning of the band, and to adjust the banded tissue as required.  The patient was discharged home without pain or other issues.  Dietary and behavioral recommendations were given and along with follow-up instructions.     The following adjunctive treatments were recommended: Daily fiber supplement  The patient will return as needed for follow-up and possible additional banding as required. She has completed 3 bandings thus far, hopefully no further treatment is needed. She has had a nice response to therapy with no further symptoms from her hemorrhoids.  No complications were encountered and the patient tolerated the procedure well.  Lyons Cellar, MD [facility] Pager [phone]

## 2016-03-21 NOTE — Patient Instructions (Signed)

## 2016-03-23 ENCOUNTER — Encounter: Payer: Self-pay | Admitting: Pulmonary Disease

## 2016-03-23 ENCOUNTER — Ambulatory Visit (INDEPENDENT_AMBULATORY_CARE_PROVIDER_SITE_OTHER): Payer: Medicare HMO | Admitting: Pulmonary Disease

## 2016-03-23 ENCOUNTER — Encounter (INDEPENDENT_AMBULATORY_CARE_PROVIDER_SITE_OTHER): Payer: Medicare HMO | Admitting: Pulmonary Disease

## 2016-03-23 VITALS — BP 112/64 | HR 69 | Ht 63.0 in | Wt 153.0 lb

## 2016-03-23 DIAGNOSIS — J984 Other disorders of lung: Secondary | ICD-10-CM | POA: Diagnosis not present

## 2016-03-23 LAB — PULMONARY FUNCTION TEST
DL/VA % pred: 88 %
DL/VA: 4.14 ml/min/mmHg/L
DLCO UNC % PRED: 55 %
DLCO UNC: 12.74 ml/min/mmHg
DLCO cor % pred: 54 %
DLCO cor: 12.41 ml/min/mmHg
FEF 25-75 PRE: 0.9 L/s
FEF 25-75 Post: 1.49 L/sec
FEF2575-%Change-Post: 65 %
FEF2575-%PRED-POST: 124 %
FEF2575-%Pred-Pre: 75 %
FEV1-%CHANGE-POST: 14 %
FEV1-%PRED-POST: 69 %
FEV1-%PRED-PRE: 60 %
FEV1-PRE: 1.05 L
FEV1-Post: 1.21 L
FEV1FVC-%Change-Post: 3 %
FEV1FVC-%Pred-Pre: 109 %
FEV6-%Change-Post: 11 %
FEV6-%PRED-POST: 66 %
FEV6-%PRED-PRE: 59 %
FEV6-POST: 1.47 L
FEV6-PRE: 1.32 L
FEV6FVC-%PRED-POST: 106 %
FEV6FVC-%PRED-PRE: 106 %
FVC-%CHANGE-POST: 11 %
FVC-%PRED-PRE: 55 %
FVC-%Pred-Post: 62 %
FVC-POST: 1.47 L
FVC-PRE: 1.32 L
POST FEV6/FVC RATIO: 100 %
PRE FEV6/FVC RATIO: 100 %
Post FEV1/FVC ratio: 82 %
Pre FEV1/FVC ratio: 80 %
RV % PRED: 64 %
RV: 1.55 L
TLC % pred: 63 %
TLC: 3.11 L

## 2016-03-23 NOTE — Progress Notes (Signed)
Autumn Galloway    [id number]    [birth year]  [facility], Hilliard Clark, MD  Referring Physician: Renato Shin, MD [place] [employer] [place], [place] [address]  Chief complaint:   Follow-up for dyspnea on exertion.  HPI: Pt. Presents to the office today with the complaint of heavy breathing when she walks.greater than one block.She states she has had this for about 1 year.Severity is moderate with exertion. It resolves within 5 minutes after sitting down, or resting.She states she has only had wheezing x 1 with these episodes.She also has fatigue with the shortness of breath.She states that the fatigue is more of a chronic problem. She also has chronic anemia.She did have breast cancer 10 years ago and has had left mastectomy. She is no longer under surveillance for her cancer.She has not had any new exposures.There are no birds or animals in the house.She does note new onset left foot  edema.She denies fever, chest pain, purulent secretions,orthopnea, or hemoptysis.No family history of lung disease. She does have a hiatal hernia, and scoliosis.  Outpatient Encounter Prescriptions as of 03/23/2016  Medication Sig  . aspirin 81 MG tablet Take 81 mg by mouth daily.    . Calcium Carbonate (CALTRATE 600 PO) Take by mouth.  . losartan (COZAAR) 25 MG tablet Take 1 tablet (25 mg total) by mouth daily.  . metoprolol succinate (TOPROL XL) 25 MG 24 hr tablet Take 0.5 tablets (12.5 mg total) by mouth daily.  . Multiple Vitamins-Minerals (CENTRUM SILVER PO) Take 1 tablet by mouth daily.    . simvastatin (ZOCOR) 20 MG tablet TAKE ONE TABLET BY MOUTH AT BEDTIME   No facility-administered encounter medications on file as of 03/23/2016.     Allergies as of 03/23/2016 - Review Complete 03/23/2016  Allergen Reaction Noted  . Lisinopril      Past Medical History:  Diagnosis Date  . Apical lung scarring    Chronic  . BREAST CANCER, HX OF 02/28/2007  . CAROTID ARTERY STENOSIS  06/13/2010  . DIVERTICULOSIS OF COLON 09/09/2003  . DIZZINESS OR VERTIGO 02/28/2007  . ESOPHAGEAL STRICTURE 05/03/1998  . GERD 05/03/1998  . Hearing loss   . HYPERLIPIDEMIA 02/28/2007  . HYPERTENSION 02/28/2007  . INTERNAL HEMORRHOIDS 09/09/2003  . Internal hemorrhoids   . OSTEOPOROSIS 06/13/2010  . RENAL INSUFFICIENCY 12/25/2008  . Scoliosis   . THROMBOCYTOPENIA 12/25/2008  . Vitamin D deficiency     Past Surgical History:  Procedure Laterality Date  . CARPAL TUNNEL RELEASE     bilateral  . ELECTROCARDIOGRAM  05/02/2005  . HEMORRHOID BANDING    . MASTECTOMY  09/2003   left  . TONSILLECTOMY    . TUBAL LIGATION  1989  . US ECHOCARDIOGRAPHY  05/03/2004    Family History  Problem Relation Age of Onset  . Breast cancer Sister     x 2  . Breast cancer Maternal Aunt   . Breast cancer Other     nephew    Social History   Social History  . Marital status: Widowed    Spouse name: N/A  . Number of children: 4  . Years of education: N/A   Occupational History  . Retired    Social History Main Topics  . Smoking status: Never Smoker  . Smokeless tobacco: Never Used  . Alcohol use No  . Drug use: No  . Sexual activity: Not on file   Other Topics Concern  . Not on file  Social History Narrative  . No narrative on file     Review of systems: Review of Systems  Constitutional: Negative for fever and chills.  HENT: Negative.   Eyes: Negative for blurred vision.  Respiratory: as per HPI  Cardiovascular: Negative for chest pain and palpitations.  Gastrointestinal: Negative for vomiting, diarrhea, blood per rectum. Genitourinary: Negative for dysuria, urgency, frequency and hematuria.  Musculoskeletal: Negative for myalgias, back pain and joint pain.  Skin: Negative for itching and rash.  Neurological: Negative for dizziness, tremors, focal weakness, seizures and loss of consciousness.  Endo/Heme/Allergies: Negative for environmental allergies.  Psychiatric/Behavioral:  Negative for depression, suicidal ideas and hallucinations.  All other systems reviewed and are negative.   Physical Exam: Blood pressure 112/64, pulse 69, height 5\' 3"  (1.6 m), weight 153 lb (69.4 kg), SpO2 97 %. Gen:      No acute distress HEENT:  EOMI, sclera anicteric Neck:     No masses; no thyromegaly Lungs:    Clear to auscultation bilaterally; normal respiratory effort CV:         Regular rate and rhythm; no murmurs Abd:      + bowel sounds; soft, non-tender; no palpable masses, no distension Ext:    No edema; adequate peripheral perfusion Skin:      Warm and dry; no rash Neuro: alert and oriented x 3 Psych: normal mood and affect  Data Reviewed: PFTs 03/23/16 FVC 1.8 (55%)  FEV1 1.05 (60%) F/F 80 TLC 63% DLCO 55% Moderate restriction with moderate reduction in diffusion capacity.  Assessment:  [name] has been referred for evaluation of dyspnea, restrictive process on PFTs. She feels that her symptoms are manageable and does not need inhalers. I suspect that her restriction is secondary to the hiatal hernia and scoliosis. She is a never smoker and has no exposures.  ILD is a consideration but she would not want biopsy or aggressive treatment even if confirmed. We will continue to monitor  Plan/Recommendations: Continue monitoring of symptoms Return in 1 year   Marshell Garfinkel MD [facility] Pager [phone] 03/23/2016, 4:07 PM  CC: Renato Shin, MD

## 2016-03-23 NOTE — Patient Instructions (Signed)
The PFTs were reviewed with her today. They do show some reduction in lung capacity which I believe is secondary to her hiatal hernia and scoliosis. Continue to monitor your symptoms. We will start inhalers if there is any worsening but for now we'll continue to observe off any medication.  Return to clinic in 1

## 2016-04-10 DIAGNOSIS — R69 Illness, unspecified: Secondary | ICD-10-CM | POA: Diagnosis not present

## 2016-05-05 ENCOUNTER — Encounter: Payer: Self-pay | Admitting: Endocrinology

## 2016-05-05 ENCOUNTER — Ambulatory Visit (INDEPENDENT_AMBULATORY_CARE_PROVIDER_SITE_OTHER): Payer: Medicare HMO | Admitting: Endocrinology

## 2016-05-05 VITALS — BP 108/54 | HR 68 | Temp 97.6°F | Ht 63.0 in | Wt 149.0 lb

## 2016-05-05 DIAGNOSIS — I10 Essential (primary) hypertension: Secondary | ICD-10-CM | POA: Diagnosis not present

## 2016-05-05 MED ORDER — BENZONATATE 100 MG PO CAPS: 100.0000 mg | ORAL_CAPSULE | Freq: Two times a day (BID) | ORAL | 0 refills | Status: DC | PRN

## 2016-05-05 NOTE — Patient Instructions (Signed)
Please stop taking the metoprolol, and:  Loratadine and flonase (non-prescription) will help your runny nose.  I have sent a prescription to your pharmacy, for the cough.   I hope you feel better soon.  If you don't feel better in 2 weeks, please call back.  Please call sooner if you get worse.

## 2016-05-05 NOTE — Progress Notes (Signed)
Subjective:    Patient ID: Autumn Galloway, female    DOB: [birth year], 82 y.o.   MRN: [id number]  HPI Pt states 5 days of slight hoarseness in the throat, and assoc dry cough.  She has rhinorrhea.   Past Medical History:  Diagnosis Date  . Apical lung scarring    Chronic  . BREAST CANCER, HX OF 02/28/2007  . CAROTID ARTERY STENOSIS 06/13/2010  . DIVERTICULOSIS OF COLON 09/09/2003  . DIZZINESS OR VERTIGO 02/28/2007  . ESOPHAGEAL STRICTURE 05/03/1998  . GERD 05/03/1998  . Hearing loss   . HYPERLIPIDEMIA 02/28/2007  . HYPERTENSION 02/28/2007  . INTERNAL HEMORRHOIDS 09/09/2003  . Internal hemorrhoids   . OSTEOPOROSIS 06/13/2010  . RENAL INSUFFICIENCY 12/25/2008  . Scoliosis   . THROMBOCYTOPENIA 12/25/2008  . Vitamin D deficiency     Past Surgical History:  Procedure Laterality Date  . CARPAL TUNNEL RELEASE     bilateral  . ELECTROCARDIOGRAM  05/02/2005  . HEMORRHOID BANDING    . MASTECTOMY  09/2003   left  . TONSILLECTOMY    . TUBAL LIGATION  1989  . US ECHOCARDIOGRAPHY  05/03/2004    Social History   Social History  . Marital status: Widowed    Spouse name: N/A  . Number of children: 4  . Years of education: N/A   Occupational History  . Retired    Social History Main Topics  . Smoking status: Never Smoker  . Smokeless tobacco: Never Used  . Alcohol use No  . Drug use: No  . Sexual activity: Not on file   Other Topics Concern  . Not on file   Social History Narrative  . No narrative on file    Current Outpatient Prescriptions on File Prior to Visit  Medication Sig Dispense Refill  . aspirin 81 MG tablet Take 81 mg by mouth daily.      . Calcium Carbonate (CALTRATE 600 PO) Take by mouth.    . losartan (COZAAR) 25 MG tablet Take 1 tablet (25 mg total) by mouth daily. 90 tablet 1  . Multiple Vitamins-Minerals (CENTRUM SILVER PO) Take 1 tablet by mouth daily.      . simvastatin (ZOCOR) 20 MG tablet TAKE ONE TABLET BY MOUTH AT BEDTIME 90 tablet 0   No current  facility-administered medications on file prior to visit.     Allergies  Allergen Reactions  . Lisinopril     REACTION: Cough    Family History  Problem Relation Age of Onset  . Breast cancer Sister     x 2  . Breast cancer Maternal Aunt   . Breast cancer Other     nephew    BP (!) 108/54   Pulse 68   Temp 97.6 F (36.4 C) (Oral)   Ht 5\' 3"  (1.6 m)   Wt 149 lb (67.6 kg)   SpO2 96%   BMI 26.39 kg/m    Review of Systems Denies fever and dizziness.     Objective:   Physical Exam VITAL SIGNS:  See vs page GENERAL: no distress head: no deformity  eyes: no periorbital swelling, no proptosis  external nose and ears are normal  mouth: no lesion seen Both eac's and tm's are normal LUNGS:  Clear to auscultation      Assessment & Plan:  Allergic rhinitis, new HTN: slightly overcontrolled.   Patient is advised the following: Patient Instructions  Please stop taking the metoprolol, and:  Loratadine and flonase (non-prescription) will help your runny nose.  I have sent a prescription to your pharmacy, for the cough.   I hope you feel better soon.  If you don't feel better in 2 weeks, please call back.  Please call sooner if you get worse.

## 2016-05-11 DIAGNOSIS — E611 Iron deficiency: Secondary | ICD-10-CM | POA: Diagnosis not present

## 2016-05-11 DIAGNOSIS — N182 Chronic kidney disease, stage 2 (mild): Secondary | ICD-10-CM | POA: Diagnosis not present

## 2016-05-11 DIAGNOSIS — E559 Vitamin D deficiency, unspecified: Secondary | ICD-10-CM | POA: Diagnosis not present

## 2016-05-17 ENCOUNTER — Encounter: Payer: Self-pay | Admitting: Endocrinology

## 2016-05-17 DIAGNOSIS — N182 Chronic kidney disease, stage 2 (mild): Secondary | ICD-10-CM | POA: Diagnosis not present

## 2016-05-17 DIAGNOSIS — Z1231 Encounter for screening mammogram for malignant neoplasm of breast: Secondary | ICD-10-CM | POA: Diagnosis not present

## 2016-05-17 DIAGNOSIS — E559 Vitamin D deficiency, unspecified: Secondary | ICD-10-CM | POA: Diagnosis not present

## 2016-05-17 DIAGNOSIS — I129 Hypertensive chronic kidney disease with stage 1 through stage 4 chronic kidney disease, or unspecified chronic kidney disease: Secondary | ICD-10-CM | POA: Diagnosis not present

## 2016-05-17 DIAGNOSIS — Z853 Personal history of malignant neoplasm of breast: Secondary | ICD-10-CM | POA: Diagnosis not present

## 2016-07-10 ENCOUNTER — Telehealth: Payer: Self-pay | Admitting: Endocrinology

## 2016-07-11 NOTE — Telephone Encounter (Signed)
I contacted the patient and advised medical records have to come from our medical records department.

## 2016-07-11 NOTE — Telephone Encounter (Signed)
Patient is call you about her medical records.  Please give her a call

## 2016-07-20 ENCOUNTER — Other Ambulatory Visit: Payer: Self-pay

## 2016-07-20 ENCOUNTER — Telehealth: Payer: Self-pay | Admitting: Endocrinology

## 2016-07-20 NOTE — Telephone Encounter (Signed)
Okay 

## 2016-07-20 NOTE — Telephone Encounter (Signed)
Patient advised form has been completed and she will call back with the fax number so the form can be submitted.

## 2016-07-20 NOTE — Telephone Encounter (Signed)
Patient called stating she is getting ready to move between assisted living centers and needs a new FL2 form completed to be eligible for the move. I have placed the form on your desk, would you be wiling to sign for Dr. Loanne Drilling while he is out of town?  Thanks!

## 2016-07-21 NOTE — Telephone Encounter (Signed)
[facility] home [facility] need a physical note on patient fax to there office,    Phone# [phone]

## 2016-07-26 ENCOUNTER — Encounter: Payer: Self-pay | Admitting: Endocrinology

## 2016-07-26 ENCOUNTER — Ambulatory Visit (INDEPENDENT_AMBULATORY_CARE_PROVIDER_SITE_OTHER): Payer: Medicare HMO | Admitting: Endocrinology

## 2016-07-26 VITALS — BP 146/86 | HR 81 | Ht 63.0 in | Wt 150.0 lb

## 2016-07-26 DIAGNOSIS — Z0000 Encounter for general adult medical examination without abnormal findings: Secondary | ICD-10-CM

## 2016-07-26 DIAGNOSIS — I10 Essential (primary) hypertension: Secondary | ICD-10-CM

## 2016-07-26 NOTE — Patient Instructions (Addendum)
Please consider these measures for your health:  minimize alcohol.  Do not use tobacco products.  Have a colonoscopy at least every 10 years from age 81.  Women should have an annual mammogram from age 40.  Keep firearms safely stored.  Always use seat belts.  have working smoke alarms in your home.  See an eye doctor and dentist regularly.  Never drive under the influence of alcohol or drugs (including prescription drugs).  Those with fair skin should take precautions against the sun, and should carefully examine their skin once per month, for any new or changed moles. It is critically important to prevent falling down (keep floor areas well-lit, dry, and free of loose objects.  If you have a cane, walker, or wheelchair, you should use it, even for short trips around the house.  Wear flat-soled shoes.  Also, try not to rush).   good diet and exercise significantly improve your health.  please let me know if you wish to be referred to a dietician.  high blood sugar is very risky to your health.  you should see an eye doctor and dentist every year.  It is very important to get all recommended vaccinations.  Please come back for a follow-up appointment in 4 months.  We'll recheck the blood pressure then.

## 2016-07-26 NOTE — Progress Notes (Signed)
Subjective:    Patient ID: Autumn Galloway, female    DOB: 04-13-1931, 83 y.o.   MRN: [id number]  HPI Pt returns for f/u of HTN.  she brings a record of her BP's which i have reviewed today.  It varies from 147-166/86-92.   Past Medical History:  Diagnosis Date  . Apical lung scarring    Chronic  . BREAST CANCER, HX OF 02/28/2007  . CAROTID ARTERY STENOSIS 06/13/2010  . DIVERTICULOSIS OF COLON 09/09/2003  . DIZZINESS OR VERTIGO 02/28/2007  . ESOPHAGEAL STRICTURE 05/03/1998  . GERD 05/03/1998  . Hearing loss   . HYPERLIPIDEMIA 02/28/2007  . HYPERTENSION 02/28/2007  . INTERNAL HEMORRHOIDS 09/09/2003  . Internal hemorrhoids   . OSTEOPOROSIS 06/13/2010  . RENAL INSUFFICIENCY 12/25/2008  . Scoliosis   . THROMBOCYTOPENIA 12/25/2008  . Vitamin D deficiency     Past Surgical History:  Procedure Laterality Date  . CARPAL TUNNEL RELEASE     bilateral  . ELECTROCARDIOGRAM  05/02/2005  . HEMORRHOID BANDING    . MASTECTOMY  09/2003   left  . TONSILLECTOMY    . TUBAL LIGATION  1989  . US ECHOCARDIOGRAPHY  05/03/2004    Social History   Social History  . Marital status: Widowed    Spouse name: N/A  . Number of children: 4  . Years of education: N/A   Occupational History  . Retired    Social History Main Topics  . Smoking status: Never Smoker  . Smokeless tobacco: Never Used  . Alcohol use No  . Drug use: No  . Sexual activity: Not on file   Other Topics Concern  . Not on file   Social History Narrative  . No narrative on file    Current Outpatient Prescriptions on File Prior to Visit  Medication Sig Dispense Refill  . aspirin 81 MG tablet Take 81 mg by mouth daily.      . Calcium Carbonate (CALTRATE 600 PO) Take by mouth.    . losartan (COZAAR) 25 MG tablet Take 1 tablet (25 mg total) by mouth daily. 90 tablet 1  . Multiple Vitamins-Minerals (CENTRUM SILVER PO) Take 1 tablet by mouth daily.      . simvastatin (ZOCOR) 20 MG tablet TAKE ONE TABLET BY MOUTH AT BEDTIME 90 tablet  0   No current facility-administered medications on file prior to visit.     Allergies  Allergen Reactions  . Lisinopril     REACTION: Cough    Family History  Problem Relation Age of Onset  . Breast cancer Sister     x 2  . Breast cancer Maternal Aunt   . Breast cancer Other     nephew   BP (!) 146/86   Pulse 81   Ht 5\' 3"  (1.6 m)   Wt 150 lb (68 kg)   BMI 26.57 kg/m   Review of Systems No change in chronic mild doe    Objective:   Physical Exam VS: see vs page GEN: no distress HEAD: head: no deformity eyes: no periorbital swelling, no proptosis external nose and ears are normal mouth: no lesion seen EARS: bilat HA's. NECK: supple, thyroid is not enlarged CHEST WALL: no deformity LUNGS:  Clear to auscultation CV: reg rate and rhythm, no murmur ABD: abdomen is soft, nontender.  no hepatosplenomegaly.  not distended.  no hernia MUSCULOSKELETAL: muscle bulk and strength are grossly normal.  no obvious joint swelling.  gait is slow but steady EXTEMITIES: no deformity.  no ulcer  on the feet.  feet are of normal color and temp.  no edema PULSES: dorsalis pedis intact bilat.  no carotid bruit NEURO:  cn 2-12 grossly intact.   readily moves all 4's.  sensation is intact to touch on the feet SKIN:  Normal texture and temperature.  No rash or suspicious lesion is visible.   NODES:  None palpable at the neck PSYCH: alert, well-oriented.  Does not appear anxious nor depressed.    I personally reviewed electrocardiogram tracing (today): Indication: HTN Impression: LAE    Assessment & Plan:  HTN: borderline control.  she declines to increase cozaar.  Subjective:   Patient here for Medicare annual wellness visit and management of other chronic and acute problems.     Risk factors: advanced age    80 of Physicians Providing Medical Care to Patient:  See "snapshot"   Activities of Daily Living: In your present state of health, do you have any difficulty  performing the following activities (she lives at [facility])?:  Preparing food and eating?: No  Bathing yourself: No  Getting dressed: No  Using the toilet: No  Moving around from place to place: No  In the past year have you fallen or had a near fall?: No    Home Safety: Has smoke detector and wears seat belts. No firearms. No excess sun exposure.  Diet and Exercise  Current exercise habits: limited by health probs Dietary issues discussed: pt reports a healthy diet   Depression Screen  Q1: Over the past two weeks, have you felt down, depressed or hopeless? no  Q2: Over the past two weeks, have you felt little interest or pleasure in doing things? no   The following portions of the patient's history were reviewed and updated as appropriate: allergies, current medications, past family history, past medical history, past social history, past surgical history and problem list.   Review of Systems  No change in chronic hearing loss, and visual loss.   Objective:   Vision:  [employer]," in [place]. Hearing: grossly slightly decreased Body mass index:  See vs page Msk: pt slowly performs "get-up-and-go" from a sitting position.   Cognitive Impairment Assessment: cognition, memory and judgment appear normal.  remembers 3/3 at 5 minutes.  excellent recall.  can easily read and write a sentence.  alert and oriented x 3.     Assessment:   Medicare wellness utd on preventive parameters.  We discussed the fact that she does not require assisted living based on her memory loss, which is mild.  I did forms.    Plan:   During the course of the visit the patient was educated and counseled about appropriate screening and preventive services including:        Fall prevention   Screening mammography is UTD Bone densitometry screening is UTD Diabetes screening is UTD Nutrition counseling is advised today.   Vaccines / LABS Zostavax / Pneumococcal Vaccine  today    Patient  Instructions (the written plan) was given to the patient.

## 2016-07-26 NOTE — Telephone Encounter (Signed)
Patient has a office visit with Dr. Loanne Drilling on 07/26/2016 forms will be addressed then.

## 2016-07-26 NOTE — Progress Notes (Signed)
Vision Test:  Left Eye 20/25 Right Eye: 20/25

## 2016-07-27 NOTE — Telephone Encounter (Signed)
Autumn Galloway [phone] F [phone]  They are stating they have still not received the physician certification from from [place]

## 2016-07-27 NOTE — Telephone Encounter (Signed)
Fax submitted 07/28/2015.

## 2016-08-08 NOTE — Telephone Encounter (Signed)
Autumn Galloway from [facility] calling on the status of a form that was filled out.  [phone]

## 2016-08-08 NOTE — Telephone Encounter (Signed)
Patient called and requested me to call her nurse Maeola Sarah) at the facility she is currently living at to discuss her medications. She gave me the number to call back [phone].

## 2016-08-08 NOTE — Telephone Encounter (Signed)
Requested a call back to further discuss.  

## 2016-08-08 NOTE — Telephone Encounter (Signed)
Patient asked you to give her a call.

## 2016-08-11 NOTE — Telephone Encounter (Signed)
Shaquenia from [employer] state patient would like to know if she can administer her own vitamins. Please advise [phone]

## 2016-08-13 NOTE — Progress Notes (Signed)
Subjective:    Patient ID: Autumn Galloway, female    DOB: 08/31/1930, 83 y.o.   MRN: [id number]  HPI Pt says her memory is good, and she wants to self-administer meds. Pt is here with dtr Luellen Pucker, [facility]), who says pt has intermittent memory loss--for example, dtr says pt thought dtr was going to [facility] for the entire winter, rather than just a brief vacation).   Past Medical History:  Diagnosis Date  . Apical lung scarring    Chronic  . BREAST CANCER, HX OF 02/28/2007  . CAROTID ARTERY STENOSIS 06/13/2010  . DIVERTICULOSIS OF COLON 09/09/2003  . DIZZINESS OR VERTIGO 02/28/2007  . ESOPHAGEAL STRICTURE 05/03/1998  . GERD 05/03/1998  . Hearing loss   . HYPERLIPIDEMIA 02/28/2007  . HYPERTENSION 02/28/2007  . INTERNAL HEMORRHOIDS 09/09/2003  . Internal hemorrhoids   . OSTEOPOROSIS 06/13/2010  . RENAL INSUFFICIENCY 12/25/2008  . Scoliosis   . THROMBOCYTOPENIA 12/25/2008  . Vitamin D deficiency     Past Surgical History:  Procedure Laterality Date  . CARPAL TUNNEL RELEASE     bilateral  . ELECTROCARDIOGRAM  05/02/2005  . HEMORRHOID BANDING    . MASTECTOMY  09/2003   left  . TONSILLECTOMY    . TUBAL LIGATION  1989  . US ECHOCARDIOGRAPHY  05/03/2004    Social History   Social History  . Marital status: Widowed    Spouse name: N/A  . Number of children: 4  . Years of education: N/A   Occupational History  . Retired    Social History Main Topics  . Smoking status: Never Smoker  . Smokeless tobacco: Never Used  . Alcohol use No  . Drug use: No  . Sexual activity: Not on file   Other Topics Concern  . Not on file   Social History Narrative  . No narrative on file    Current Outpatient Prescriptions on File Prior to Visit  Medication Sig Dispense Refill  . aspirin 81 MG tablet Take 81 mg by mouth daily.      . Calcium Carbonate (CALTRATE 600 PO) Take by mouth.    . losartan (COZAAR) 25 MG tablet Take 1 tablet (25 mg total) by mouth daily. 90 tablet 1  . Multiple  Vitamins-Minerals (CENTRUM SILVER PO) Take 1 tablet by mouth daily.      . simvastatin (ZOCOR) 20 MG tablet TAKE ONE TABLET BY MOUTH AT BEDTIME 90 tablet 0   No current facility-administered medications on file prior to visit.     Allergies  Allergen Reactions  . Lisinopril     REACTION: Cough    Family History  Problem Relation Age of Onset  . Breast cancer Sister     x 2  . Breast cancer Maternal Aunt   . Breast cancer Other     nephew    BP (!) 142/84   Pulse 86   Ht 5\' 3"  (1.6 m)   Wt 148 lb (67.1 kg)   SpO2 95%   BMI 26.22 kg/m    Review of Systems Denies falls.      Objective:   Physical Exam VITAL SIGNS:  See vs page GENERAL: no distress.   Orientation to time 5 From broadest to most narrow: 5 Orientation to place 5 From broadest to most narrow: 5 Registration 3 Repeating named prompts: 3 Attention and calculation 5 spelling "world" backwards: 5 Recall 3 Registration recall: 3 Language 2 Naming a pencil and a watch: 2  Repetition 1 Speaking back  a phrase: 1  Complex commands 6 draw figure: 6.      Assessment & Plan:  Memory loss, improved, now mild I did form saying pt can take her own meds Please continue the same medications.

## 2016-08-14 NOTE — Telephone Encounter (Signed)
Ok to self-administer

## 2016-08-14 NOTE — Telephone Encounter (Signed)
In order to address this, I need the forms and verbal orders I signed.

## 2016-08-14 NOTE — Telephone Encounter (Signed)
Forms submitted on 07/27/2016. Confirmation was received on 07/27/2016 at 1053 am.

## 2016-08-14 NOTE — Telephone Encounter (Signed)
Attempted to reach [place], but she was not available. Will try again at a later time.

## 2016-08-14 NOTE — Telephone Encounter (Signed)
See message and please advise, Thanks!  

## 2016-08-14 NOTE — Telephone Encounter (Signed)
Form placed back on your desk to review.

## 2016-08-16 NOTE — Telephone Encounter (Signed)
I sent a fax to Nanine Means advising of MD's message.

## 2016-08-17 ENCOUNTER — Ambulatory Visit (INDEPENDENT_AMBULATORY_CARE_PROVIDER_SITE_OTHER): Payer: Medicare HMO | Admitting: Endocrinology

## 2016-08-17 ENCOUNTER — Encounter: Payer: Self-pay | Admitting: Endocrinology

## 2016-08-17 DIAGNOSIS — G3184 Mild cognitive impairment, so stated: Secondary | ICD-10-CM

## 2016-08-17 NOTE — Patient Instructions (Addendum)
You can self-administer your meds.   I'll see you next time.

## 2016-08-18 ENCOUNTER — Ambulatory Visit: Payer: Medicare HMO | Admitting: Endocrinology

## 2016-08-18 NOTE — Telephone Encounter (Signed)
Autumn Galloway from [facility] called asked you to give her a call [phone]

## 2016-08-20 DIAGNOSIS — G3184 Mild cognitive impairment, so stated: Secondary | ICD-10-CM | POA: Insufficient documentation

## 2016-09-14 IMAGING — CR DG FOOT COMPLETE 3+V*R*
3 series · 3 of 3 positions shown · non-contrast
Comparison: 04/15/2009

CLINICAL DATA: Heel pain for 1 month

EXAM:
RIGHT FOOT COMPLETE - 3+ VIEW

[t foot ap right]
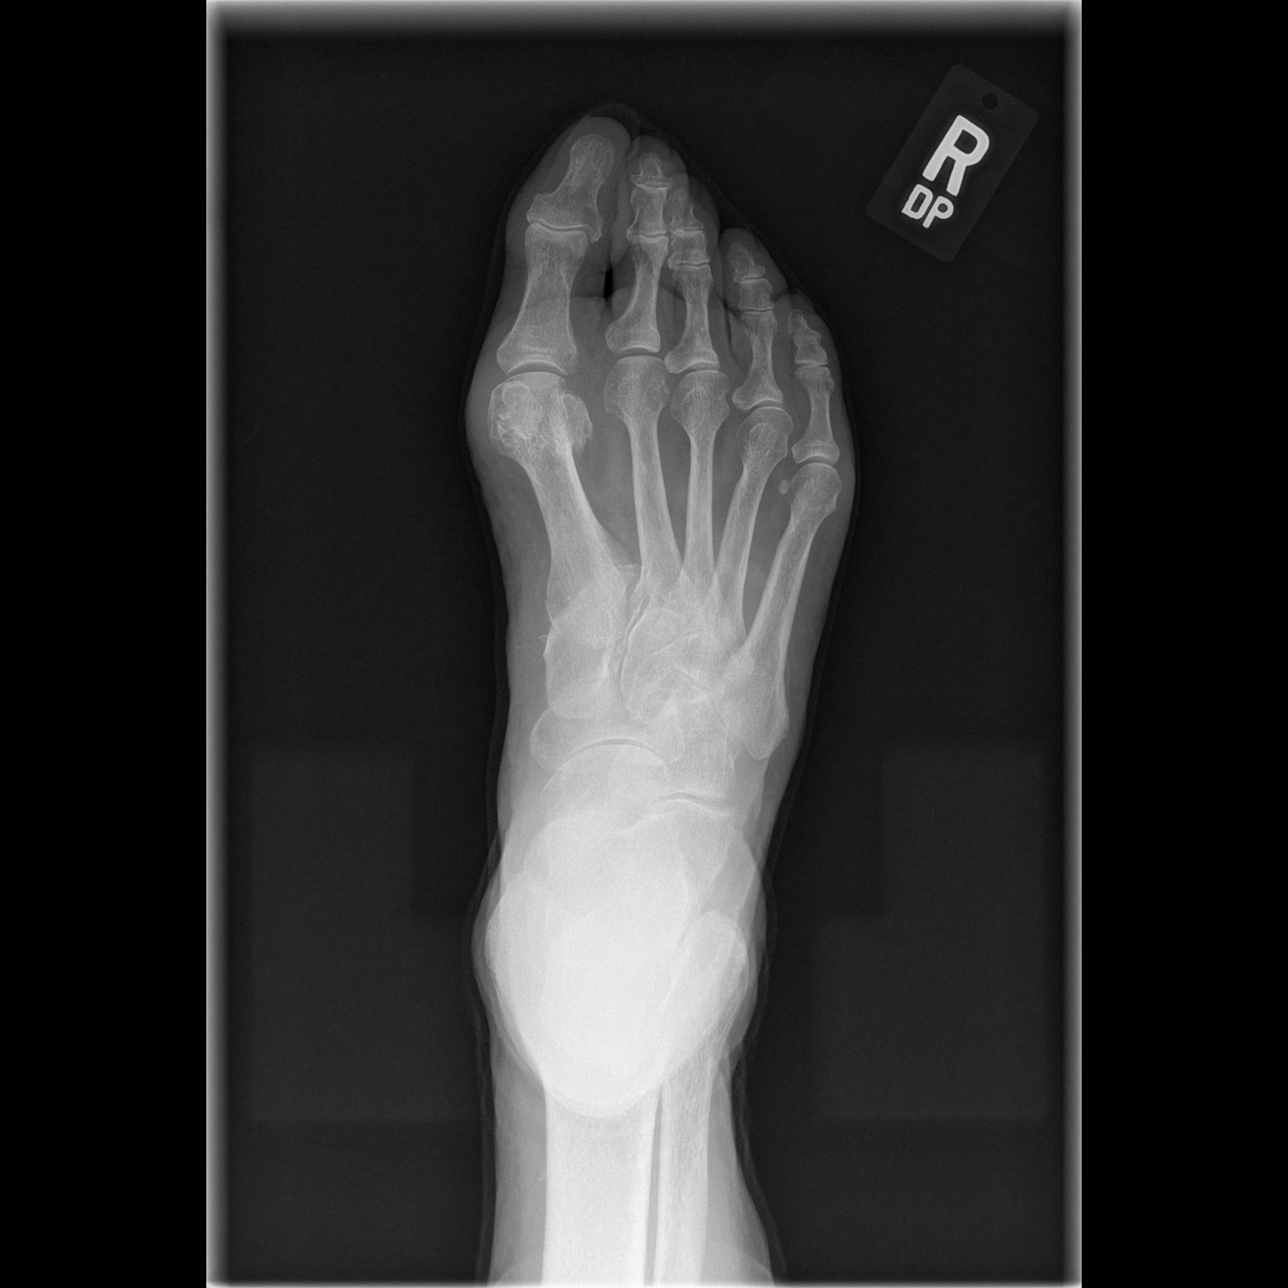

[t foot oblique right]
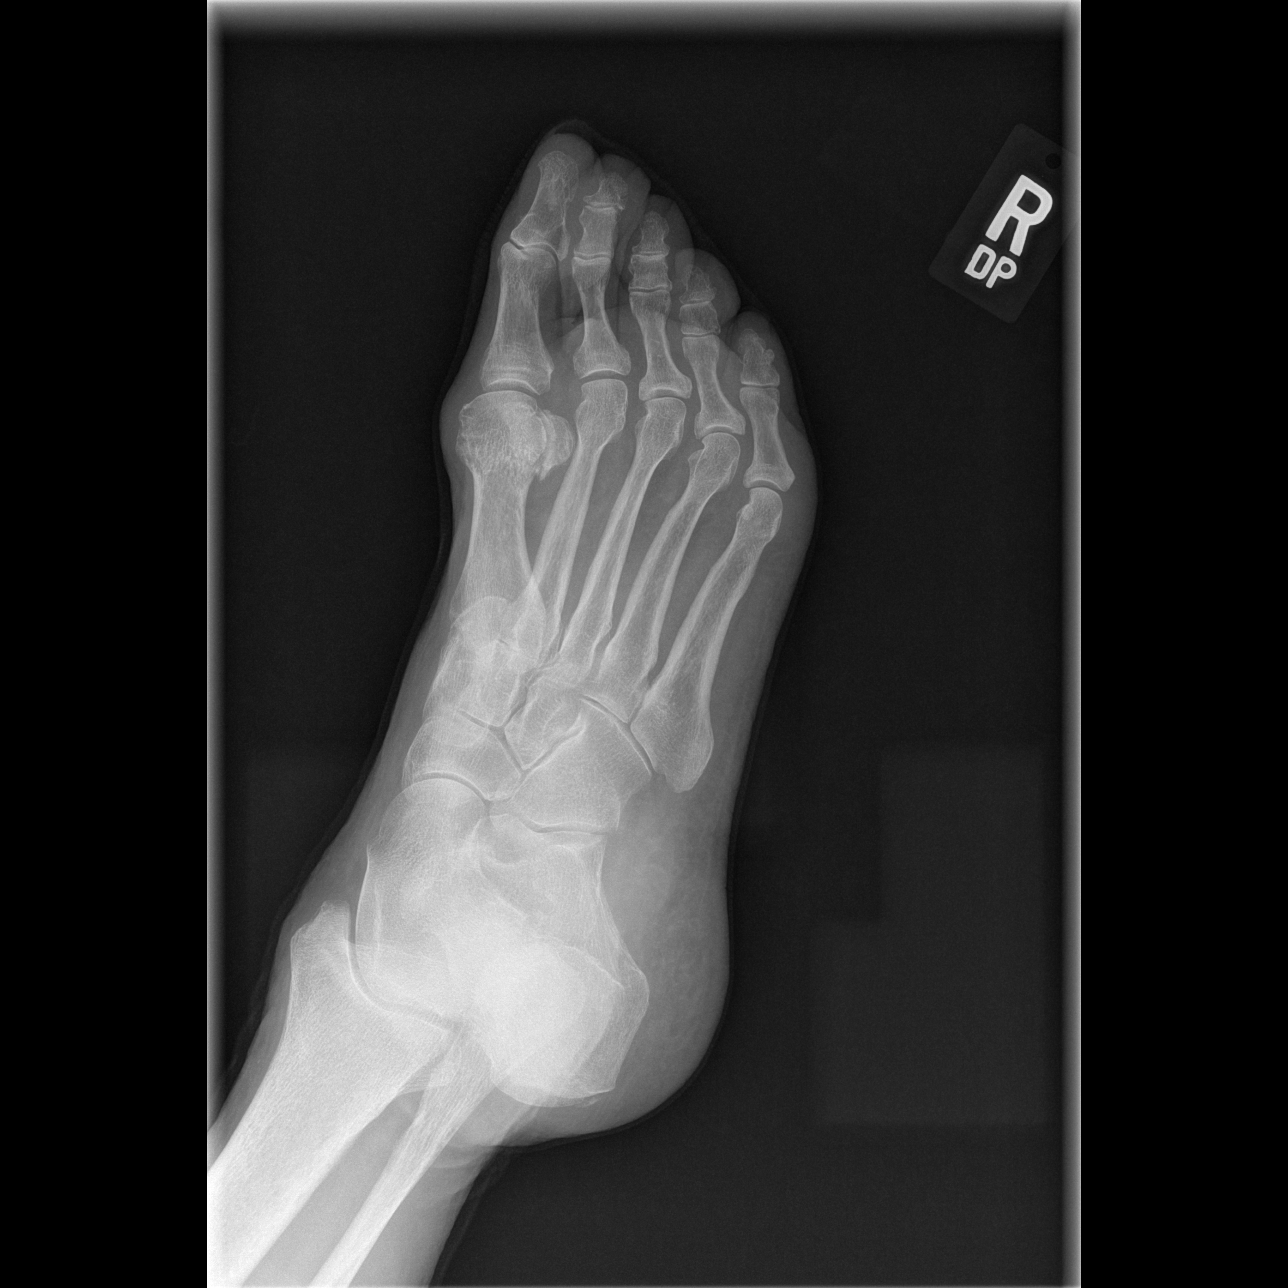

[t foot lat right]
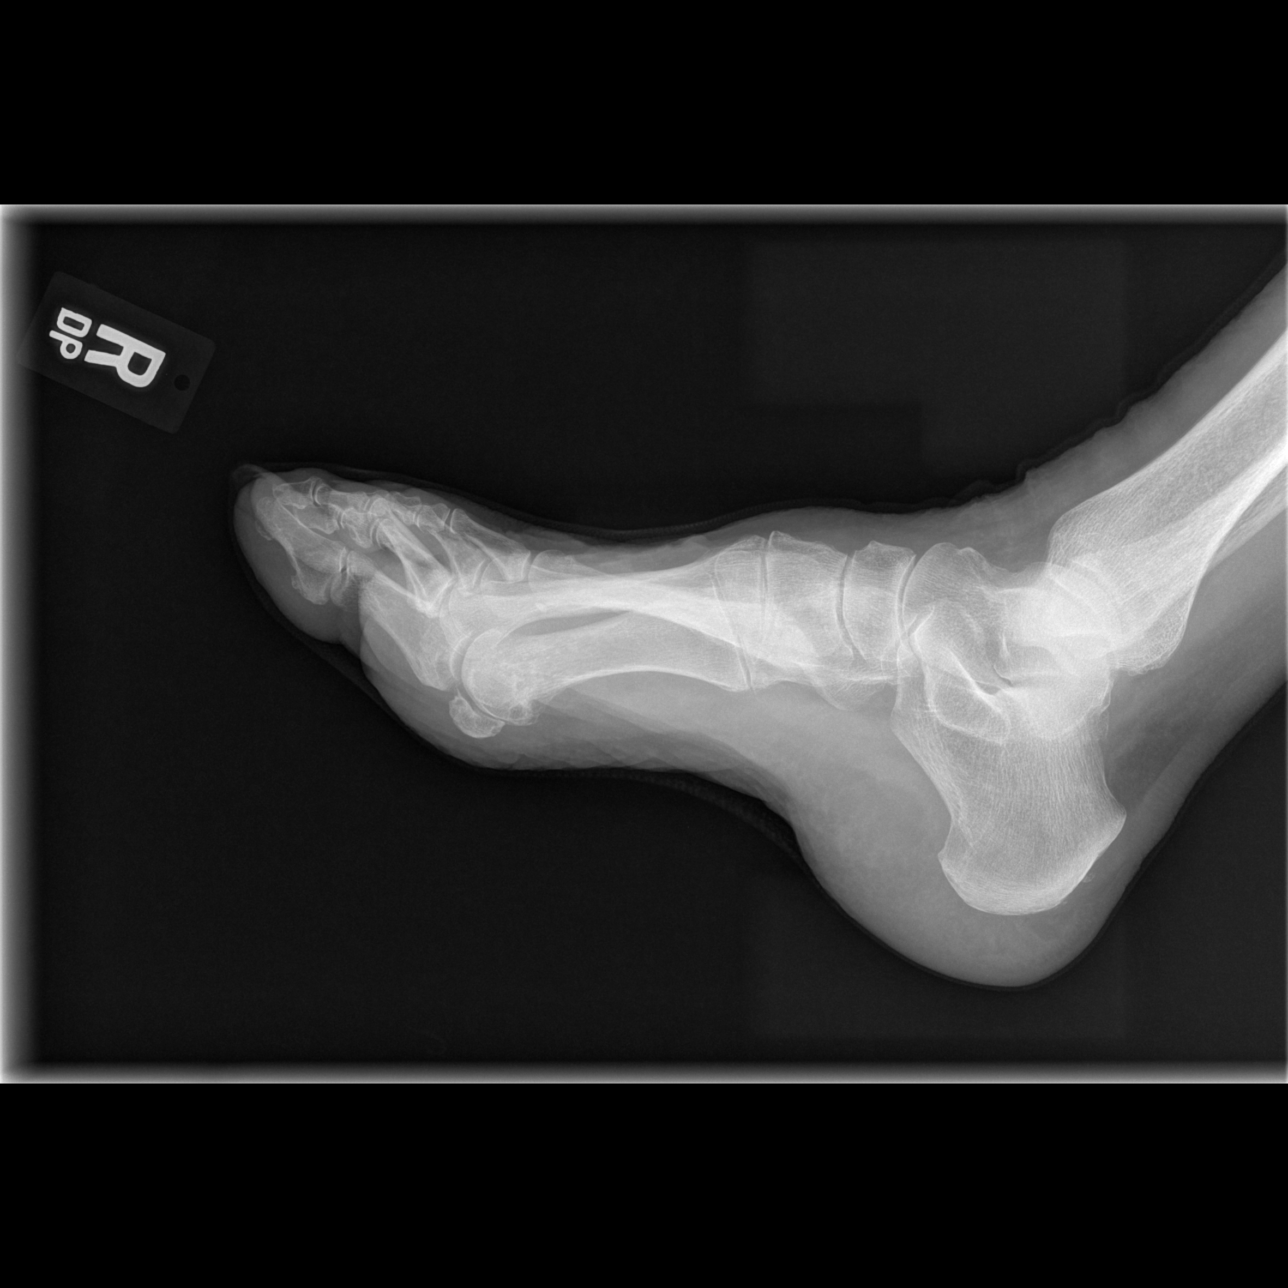

[3 of 3 positions shown; findings below may reference images not displayed]

FINDINGS: There is no evidence of fracture or dislocation. Mild to moderate
hallux valgus deformity identified. There is no evidence of
arthropathy or other focal bone abnormality. Soft tissues are
unremarkable.
IMPRESSION: 1. No acute findings and no abnormality involving the heel.
2. Hallux valgus deformity.

## 2016-09-18 ENCOUNTER — Telehealth: Payer: Self-pay | Admitting: Endocrinology

## 2016-09-18 DIAGNOSIS — N182 Chronic kidney disease, stage 2 (mild): Secondary | ICD-10-CM | POA: Diagnosis not present

## 2016-09-18 DIAGNOSIS — E611 Iron deficiency: Secondary | ICD-10-CM | POA: Diagnosis not present

## 2016-09-18 DIAGNOSIS — E559 Vitamin D deficiency, unspecified: Secondary | ICD-10-CM | POA: Diagnosis not present

## 2016-09-18 NOTE — Telephone Encounter (Signed)
Pt called in stating that Autumn Galloway needs to contact [facility] by fax to request a copy of labs that were ordered by Dr.  Marval Regal ordered.  She went to [facility] in [place] on [address].

## 2016-09-18 NOTE — Telephone Encounter (Signed)
I contacted the patient and advised via voicemail I have contacted [facility] to have the labs sent over as soon as they are resulted.

## 2016-09-19 ENCOUNTER — Encounter: Payer: Self-pay | Admitting: Endocrinology

## 2016-09-19 ENCOUNTER — Ambulatory Visit (INDEPENDENT_AMBULATORY_CARE_PROVIDER_SITE_OTHER): Payer: Medicare HMO | Admitting: Endocrinology

## 2016-09-19 VITALS — BP 136/88 | HR 103 | Ht 63.0 in | Wt 152.0 lb

## 2016-09-19 DIAGNOSIS — I10 Essential (primary) hypertension: Secondary | ICD-10-CM

## 2016-09-19 NOTE — Patient Instructions (Addendum)
Please continue the same medications  Please come back for a follow-up appointment in 2-3 months.   

## 2016-09-19 NOTE — Progress Notes (Signed)
   Subjective:    Patient ID: Autumn Galloway, female    DOB: 12-22-1930, 83 y.o.   MRN: [id number]  HPI Pt is here because BP was high (196/90), last night.  She says she takes cozaar as rx'ed.  No change in chronic doe.  Past Medical History:  Diagnosis Date  . Apical lung scarring    Chronic  . BREAST CANCER, HX OF 02/28/2007  . CAROTID ARTERY STENOSIS 06/13/2010  . DIVERTICULOSIS OF COLON 09/09/2003  . DIZZINESS OR VERTIGO 02/28/2007  . ESOPHAGEAL STRICTURE 05/03/1998  . GERD 05/03/1998  . Hearing loss   . HYPERLIPIDEMIA 02/28/2007  . HYPERTENSION 02/28/2007  . INTERNAL HEMORRHOIDS 09/09/2003  . Internal hemorrhoids   . OSTEOPOROSIS 06/13/2010  . RENAL INSUFFICIENCY 12/25/2008  . Scoliosis   . THROMBOCYTOPENIA 12/25/2008  . Vitamin D deficiency     Past Surgical History:  Procedure Laterality Date  . CARPAL TUNNEL RELEASE     bilateral  . ELECTROCARDIOGRAM  05/02/2005  . HEMORRHOID BANDING    . MASTECTOMY  09/2003   left  . TONSILLECTOMY    . TUBAL LIGATION  1989  . US ECHOCARDIOGRAPHY  05/03/2004    Social History   Social History  . Marital status: Widowed    Spouse name: N/A  . Number of children: 4  . Years of education: N/A   Occupational History  . Retired    Social History Main Topics  . Smoking status: Never Smoker  . Smokeless tobacco: Never Used  . Alcohol use No  . Drug use: No  . Sexual activity: Not on file   Other Topics Concern  . Not on file   Social History Narrative  . No narrative on file    Current Outpatient Prescriptions on File Prior to Visit  Medication Sig Dispense Refill  . aspirin 81 MG tablet Take 81 mg by mouth daily.      . Calcium Carbonate (CALTRATE 600 PO) Take by mouth.    . losartan (COZAAR) 25 MG tablet Take 1 tablet (25 mg total) by mouth daily. 90 tablet 1  . Multiple Vitamins-Minerals (CENTRUM SILVER PO) Take 1 tablet by mouth daily.      . simvastatin (ZOCOR) 20 MG tablet TAKE ONE TABLET BY MOUTH AT BEDTIME 90 tablet 0     No current facility-administered medications on file prior to visit.     Allergies  Allergen Reactions  . Lisinopril     REACTION: Cough    Family History  Problem Relation Age of Onset  . Breast cancer Sister     x 2  . Breast cancer Maternal Aunt   . Breast cancer Other     nephew    BP 136/88   Pulse (!) 103   Ht 5\' 3"  (1.6 m)   Wt 152 lb (68.9 kg)   SpO2 95%   BMI 26.93 kg/m    Review of Systems Denies headache.     Objective:   Physical Exam VITAL SIGNS:  See vs page GENERAL: no distress LUNGS:  Clear to auscultation HEART:  Regular rate and rhythm without murmurs noted. Normal S1,S2.   Gait: steady with a quad-cane.       Assessment & Plan:  Labile HTN, better today.  Please continue the same medication Stable doe, due to pulm fibrosis. We'll folow

## 2016-10-10 ENCOUNTER — Ambulatory Visit (INDEPENDENT_AMBULATORY_CARE_PROVIDER_SITE_OTHER): Payer: Medicare HMO | Admitting: Endocrinology

## 2016-10-10 ENCOUNTER — Encounter: Payer: Self-pay | Admitting: Endocrinology

## 2016-10-10 DIAGNOSIS — R51 Headache: Secondary | ICD-10-CM | POA: Diagnosis not present

## 2016-10-10 DIAGNOSIS — R519 Headache, unspecified: Secondary | ICD-10-CM

## 2016-10-10 LAB — BASIC METABOLIC PANEL
BUN: 21 mg/dL (ref 6–23)
CO2: 31 meq/L (ref 19–32)
Calcium: 9.6 mg/dL (ref 8.4–10.5)
Chloride: 102 mEq/L (ref 96–112)
Creatinine, Ser: 0.88 mg/dL (ref 0.40–1.20)
GFR: 65.15 mL/min (ref >60.00)
Glucose, Bld: 87 mg/dL (ref 70–99)
Potassium: 4.3 mEq/L (ref 3.5–5.1)
SODIUM: 138 meq/L (ref 135–145)

## 2016-10-10 LAB — SEDIMENTATION RATE: SED RATE: 10 mm/h (ref 0–30)

## 2016-10-10 LAB — CBC WITH DIFFERENTIAL/PLATELET
Basophils Absolute: 0.1 10*3/uL (ref 0.0–0.1)
Basophils Relative: 0.9 % (ref 0.0–3.0)
EOS PCT: 5.2 % — AB (ref 0.0–5.0)
Eosinophils Absolute: 0.4 10*3/uL (ref 0.0–0.7)
HCT: 39.6 % (ref 36.0–46.0)
Hemoglobin: 13.1 g/dL (ref 12.0–15.0)
LYMPHS ABS: 1.4 10*3/uL (ref 0.7–4.0)
Lymphocytes Relative: 17 % (ref 12.0–46.0)
MCHC: 33 g/dL (ref 30.0–36.0)
MCV: 88 fl (ref 78.0–100.0)
Monocytes Absolute: 0.5 10*3/uL (ref 0.1–1.0)
Monocytes Relative: 6.6 % (ref 3.0–12.0)
NEUTROS ABS: 5.8 10*3/uL (ref 1.4–7.7)
NEUTROS PCT: 70.3 % (ref 43.0–77.0)
PLATELETS: 289 10*3/uL (ref 150.0–400.0)
RBC: 4.5 Mil/uL (ref 3.87–5.11)
RDW: 14 % (ref 11.5–15.5)
WBC: 8.2 10*3/uL (ref 4.0–10.5)

## 2016-10-10 MED ORDER — ZOLPIDEM TARTRATE 5 MG PO TABS: 2.5000 mg | ORAL_TABLET | Freq: Every evening | ORAL | 0 refills | Status: DC | PRN

## 2016-10-10 NOTE — Patient Instructions (Signed)
blood tests are requested for you today.  We'll let you know about the results. Here is a prescription to hep you rest at night. Call if headache gets worse.

## 2016-10-10 NOTE — Progress Notes (Signed)
Subjective:    Patient ID: Autumn Galloway, female    DOB: Jun 03, 1931, 83 y.o.   MRN: [id number]  HPI Pt states few days of slight pain at the bioccipital areas, but no assoc dizziness. She has requested facility to bring her meds.   Past Medical History:  Diagnosis Date  . Apical lung scarring    Chronic  . BREAST CANCER, HX OF 02/28/2007  . CAROTID ARTERY STENOSIS 06/13/2010  . DIVERTICULOSIS OF COLON 09/09/2003  . DIZZINESS OR VERTIGO 02/28/2007  . ESOPHAGEAL STRICTURE 05/03/1998  . GERD 05/03/1998  . Hearing loss   . HYPERLIPIDEMIA 02/28/2007  . HYPERTENSION 02/28/2007  . INTERNAL HEMORRHOIDS 09/09/2003  . Internal hemorrhoids   . OSTEOPOROSIS 06/13/2010  . RENAL INSUFFICIENCY 12/25/2008  . Scoliosis   . THROMBOCYTOPENIA 12/25/2008  . Vitamin D deficiency     Past Surgical History:  Procedure Laterality Date  . CARPAL TUNNEL RELEASE     bilateral  . ELECTROCARDIOGRAM  05/02/2005  . HEMORRHOID BANDING    . MASTECTOMY  04-Nov-2003   left  . TONSILLECTOMY    . TUBAL LIGATION  1989  . US ECHOCARDIOGRAPHY  05/03/2004    Social History   Social History  . Marital status: Widowed    Spouse name: N/A  . Number of children: 4  . Years of education: N/A   Occupational History  . Retired    Social History Main Topics  . Smoking status: Never Smoker  . Smokeless tobacco: Never Used  . Alcohol use No  . Drug use: No  . Sexual activity: Not on file   Other Topics Concern  . Not on file   Social History Narrative  . No narrative on file    Current Outpatient Prescriptions on File Prior to Visit  Medication Sig Dispense Refill  . aspirin 81 MG tablet Take 81 mg by mouth daily.      . Calcium Carbonate (CALTRATE 600 PO) Take by mouth.    . losartan (COZAAR) 25 MG tablet Take 1 tablet (25 mg total) by mouth daily. 90 tablet 1  . Multiple Vitamins-Minerals (CENTRUM SILVER PO) Take 1 tablet by mouth daily.      . simvastatin (ZOCOR) 20 MG tablet TAKE ONE TABLET BY MOUTH AT BEDTIME  90 tablet 0   No current facility-administered medications on file prior to visit.     Allergies  Allergen Reactions  . Lisinopril     REACTION: Cough    Family History  Problem Relation Age of Onset  . Breast cancer Sister     x 2  . Breast cancer Maternal Aunt   . Breast cancer Other     nephew    BP 132/84   Pulse 96   Ht 5\' 3"  (1.6 m)   Wt 152 lb (68.9 kg)   SpO2 96%   BMI 26.93 kg/m    Review of Systems Denies visual loss, LOC, n/v, and rash.  She has insomnia and depression (brother died 11/04/15; dtr is now [facility]).  No SI.      Objective:   Physical Exam VS: see vs page GEN: no distress HEAD: head: no deformity eyes: no periorbital swelling, no proptosis external nose and ears are normal mouth: no lesion seen Both eac's and tm's are normal.  NECK: supple, thyroid is not enlarged.   CHEST WALL: no deformity LUNGS: clear to auscultation CV: reg rate and rhythm, no murmur.  MUSCULOSKELETAL: gait is steady with a walker.  PULSES: no  carotid bruit NEURO:  cn 2-12 grossly intact.   readily moves all 4's.  sensation is intact to touch on the feet.  SKIN: No rash on the neck.   NODES:  None palpable at the neck PSYCH: alert, well-oriented.  Does not appear anxious nor depressed.       Assessment & Plan:  Headache, new, poss caused or exac by depression. Insomnia, new.  Patient is advised the following: Patient Instructions  blood tests are requested for you today.  We'll let you know about the results. Here is a prescription to hep you rest at night. Call if headache gets worse.

## 2016-10-18 DIAGNOSIS — R69 Illness, unspecified: Secondary | ICD-10-CM | POA: Diagnosis not present

## 2016-10-23 ENCOUNTER — Telehealth: Payer: Self-pay | Admitting: Endocrinology

## 2016-10-23 NOTE — Telephone Encounter (Signed)
Pt called in to speak with you, she requests call back.

## 2016-10-23 NOTE — Telephone Encounter (Signed)
I contacted the patient. She requested the medical records phone number. Number provided for the patient.

## 2016-10-23 NOTE — Telephone Encounter (Signed)
Attempted to reach the patient. Number on file was not working at this current time.

## 2016-10-31 DIAGNOSIS — Z79899 Other long term (current) drug therapy: Secondary | ICD-10-CM | POA: Diagnosis not present

## 2016-10-31 DIAGNOSIS — E7800 Pure hypercholesterolemia, unspecified: Secondary | ICD-10-CM | POA: Diagnosis not present

## 2016-10-31 DIAGNOSIS — D509 Iron deficiency anemia, unspecified: Secondary | ICD-10-CM | POA: Diagnosis not present

## 2016-10-31 DIAGNOSIS — R0600 Dyspnea, unspecified: Secondary | ICD-10-CM | POA: Diagnosis not present

## 2016-10-31 DIAGNOSIS — G4700 Insomnia, unspecified: Secondary | ICD-10-CM | POA: Diagnosis not present

## 2016-10-31 DIAGNOSIS — Z0000 Encounter for general adult medical examination without abnormal findings: Secondary | ICD-10-CM | POA: Diagnosis not present

## 2016-10-31 DIAGNOSIS — I10 Essential (primary) hypertension: Secondary | ICD-10-CM | POA: Diagnosis not present

## 2016-10-31 DIAGNOSIS — Z7982 Long term (current) use of aspirin: Secondary | ICD-10-CM | POA: Diagnosis not present

## 2016-10-31 DIAGNOSIS — Z9289 Personal history of other medical treatment: Secondary | ICD-10-CM | POA: Diagnosis not present

## 2016-10-31 DIAGNOSIS — R58 Hemorrhage, not elsewhere classified: Secondary | ICD-10-CM | POA: Diagnosis not present

## 2016-10-31 DIAGNOSIS — R32 Unspecified urinary incontinence: Secondary | ICD-10-CM | POA: Diagnosis not present

## 2016-11-07 ENCOUNTER — Encounter: Payer: Self-pay | Admitting: Endocrinology

## 2016-11-07 ENCOUNTER — Ambulatory Visit (INDEPENDENT_AMBULATORY_CARE_PROVIDER_SITE_OTHER): Payer: Medicare HMO | Admitting: Endocrinology

## 2016-11-07 DIAGNOSIS — S9031XA Contusion of right foot, initial encounter: Secondary | ICD-10-CM | POA: Diagnosis not present

## 2016-11-07 MED ORDER — ZOLPIDEM TARTRATE 5 MG PO TABS: 5.0000 mg | ORAL_TABLET | Freq: Every evening | ORAL | 2 refills | Status: DC | PRN

## 2016-11-07 NOTE — Patient Instructions (Addendum)
Call if you want me to remove the nodule on your back.   Here is a prescription, to double the [place].   Please come back for a follow-up appointment in 4 months.        Insomnia Insomnia is a sleep disorder that makes it difficult to fall asleep or to stay asleep. Insomnia can cause tiredness (fatigue), low energy, difficulty concentrating, mood swings, and poor performance at work or school. There are three different ways to classify insomnia:  Difficulty falling asleep.  Difficulty staying asleep.  Waking up too early in the morning. Any type of insomnia can be long-term (chronic) or short-term (acute). Both are common. Short-term insomnia usually lasts for three months or less. Chronic insomnia occurs at least three times a week for longer than three months. What are the causes? Insomnia may be caused by another condition, situation, or substance, such as:  Anxiety.  Certain medicines.  Gastroesophageal reflux disease (GERD) or other gastrointestinal conditions.  Asthma or other breathing conditions.  Restless legs syndrome, sleep apnea, or other sleep disorders.  Chronic pain.  Menopause. This may include hot flashes.  Stroke.  Abuse of alcohol, tobacco, or illegal drugs.  Depression.  Caffeine.  Neurological disorders, such as Alzheimer disease.  An overactive thyroid (hyperthyroidism). The cause of insomnia may not be known. What increases the risk? Risk factors for insomnia include:  Gender. Women are more commonly affected than men.  Age. Insomnia is more common as you get older.  Stress. This may involve your professional or personal life.  Income. Insomnia is more common in people with lower income.  Lack of exercise.  Irregular work schedule or night shifts.  Traveling between different time zones. What are the signs or symptoms? If you have insomnia, trouble falling asleep or trouble staying asleep is the main symptom. This may lead to  other symptoms, such as:  Feeling fatigued.  Feeling nervous about going to sleep.  Not feeling rested in the morning.  Having trouble concentrating.  Feeling irritable, anxious, or depressed. How is this treated? Treatment for insomnia depends on the cause. If your insomnia is caused by an underlying condition, treatment will focus on addressing the condition. Treatment may also include:  Medicines to help you sleep.  Counseling or therapy.  Lifestyle adjustments. Follow these instructions at home:  Take medicines only as directed by your health care provider.  Keep regular sleeping and waking hours. Avoid naps.  Keep a sleep diary to help you and your health care provider figure out what could be causing your insomnia. Include:  When you sleep.  When you wake up during the night.  How well you sleep.  How rested you feel the next day.  Any side effects of medicines you are taking.  What you eat and drink.  Make your bedroom a comfortable place where it is easy to fall asleep:  Put up shades or special blackout curtains to block light from outside.  Use a white noise machine to block noise.  Keep the temperature cool.  Exercise regularly as directed by your health care provider. Avoid exercising right before bedtime.  Use relaxation techniques to manage stress. Ask your health care provider to suggest some techniques that may work well for you. These may include:  Breathing exercises.  Routines to release muscle tension.  Visualizing peaceful scenes.  Cut back on alcohol, caffeinated beverages, and cigarettes, especially close to bedtime. These can disrupt your sleep.  Do not overeat or eat spicy  foods right before bedtime. This can lead to digestive discomfort that can make it hard for you to sleep.  Limit screen use before bedtime. This includes:  Watching TV.  Using your smartphone, tablet, and computer.  Stick to a routine. This can help you  fall asleep faster. Try to do a quiet activity, brush your teeth, and go to bed at the same time each night.  Get out of bed if you are still awake after 15 minutes of trying to sleep. Keep the lights down, but try reading or doing a quiet activity. When you feel sleepy, go back to bed.  Make sure that you drive carefully. Avoid driving if you feel very sleepy.  Keep all follow-up appointments as directed by your health care provider. This is important. Contact a health care provider if:  You are tired throughout the day or have trouble in your daily routine due to sleepiness.  You continue to have sleep problems or your sleep problems get worse. Get help right away if:  You have serious thoughts about hurting yourself or someone else. This information is not intended to replace advice given to you by your health care provider. Make sure you discuss any questions you have with your health care provider. Document Released: 07/07/2000 Document Revised: 12/10/2015 Document Reviewed: 04/10/2014 Elsevier Interactive Patient Education  2017 [employer].

## 2016-11-07 NOTE — Progress Notes (Signed)
Subjective:    Patient ID: Autumn Galloway, female    DOB: Nov 23, 1930, 83 y.o.   MRN: [id number]  HPI Pt states 1 month of black and blue spot at the right foot.  This started when she stumbled. Assoc pain is resolved.   Past Medical History:  Diagnosis Date  . Apical lung scarring    Chronic  . BREAST CANCER, HX OF 02/28/2007  . CAROTID ARTERY STENOSIS 06/13/2010  . DIVERTICULOSIS OF COLON 09/09/2003  . DIZZINESS OR VERTIGO 02/28/2007  . ESOPHAGEAL STRICTURE 05/03/1998  . GERD 05/03/1998  . Hearing loss   . HYPERLIPIDEMIA 02/28/2007  . HYPERTENSION 02/28/2007  . INTERNAL HEMORRHOIDS 09/09/2003  . Internal hemorrhoids   . OSTEOPOROSIS 06/13/2010  . RENAL INSUFFICIENCY 12/25/2008  . Scoliosis   . THROMBOCYTOPENIA 12/25/2008  . Vitamin D deficiency     Past Surgical History:  Procedure Laterality Date  . CARPAL TUNNEL RELEASE     bilateral  . ELECTROCARDIOGRAM  05/02/2005  . HEMORRHOID BANDING    . MASTECTOMY  09/2003   left  . TONSILLECTOMY    . TUBAL LIGATION  1989  . US ECHOCARDIOGRAPHY  05/03/2004    Social History   Social History  . Marital status: Widowed    Spouse name: N/A  . Number of children: 4  . Years of education: N/A   Occupational History  . Retired    Social History Main Topics  . Smoking status: Never Smoker  . Smokeless tobacco: Never Used  . Alcohol use No  . Drug use: No  . Sexual activity: Not on file   Other Topics Concern  . Not on file   Social History Narrative  . No narrative on file    Current Outpatient Prescriptions on File Prior to Visit  Medication Sig Dispense Refill  . aspirin 81 MG tablet Take 81 mg by mouth daily.      . Calcium Carbonate (CALTRATE 600 PO) Take by mouth.    . losartan (COZAAR) 25 MG tablet Take 1 tablet (25 mg total) by mouth daily. 90 tablet 1  . Multiple Vitamins-Minerals (CENTRUM SILVER PO) Take 1 tablet by mouth daily.      . simvastatin (ZOCOR) 20 MG tablet TAKE ONE TABLET BY MOUTH AT BEDTIME 90 tablet 0     No current facility-administered medications on file prior to visit.     Allergies  Allergen Reactions  . Lisinopril     REACTION: Cough    Family History  Problem Relation Age of Onset  . Breast cancer Sister     x 2  . Breast cancer Maternal Aunt   . Breast cancer Other     nephew    BP 132/86   Pulse 75   Ht 5\' 3"  (1.6 m)   Wt 155 lb (70.3 kg)   SpO2 93%   BMI 27.46 kg/m    Review of Systems She has a spot on her back.  Lorrin Mais does not help    Objective:   Physical Exam VITAL SIGNS:  See vs page.  GENERAL: no distress.  Mid-back: 4 mm diameter raised pigmented nodule.  Right foot: slight ecchymosis at the distal foot.  No tend/swell/erythema.     Assessment & Plan:  [place]ucca vs seborrheic keratosis, new.  Pt declines shave bx.   Foot contusion, improved.   Insomnia, worse.    Patient Instructions  Call if you want me to remove the nodule on your back.   Here is a  prescription, to double the [place].   Please come back for a follow-up appointment in 4 months.        Insomnia Insomnia is a sleep disorder that makes it difficult to fall asleep or to stay asleep. Insomnia can cause tiredness (fatigue), low energy, difficulty concentrating, mood swings, and poor performance at work or school. There are three different ways to classify insomnia:  Difficulty falling asleep.  Difficulty staying asleep.  Waking up too early in the morning. Any type of insomnia can be long-term (chronic) or short-term (acute). Both are common. Short-term insomnia usually lasts for three months or less. Chronic insomnia occurs at least three times a week for longer than three months. What are the causes? Insomnia may be caused by another condition, situation, or substance, such as:  Anxiety.  Certain medicines.  Gastroesophageal reflux disease (GERD) or other gastrointestinal conditions.  Asthma or other breathing conditions.  Restless legs syndrome, sleep apnea, or  other sleep disorders.  Chronic pain.  Menopause. This may include hot flashes.  Stroke.  Abuse of alcohol, tobacco, or illegal drugs.  Depression.  Caffeine.  Neurological disorders, such as Alzheimer disease.  An overactive thyroid (hyperthyroidism). The cause of insomnia may not be known. What increases the risk? Risk factors for insomnia include:  Gender. Women are more commonly affected than men.  Age. Insomnia is more common as you get older.  Stress. This may involve your professional or personal life.  Income. Insomnia is more common in people with lower income.  Lack of exercise.  Irregular work schedule or night shifts.  Traveling between different time zones. What are the signs or symptoms? If you have insomnia, trouble falling asleep or trouble staying asleep is the main symptom. This may lead to other symptoms, such as:  Feeling fatigued.  Feeling nervous about going to sleep.  Not feeling rested in the morning.  Having trouble concentrating.  Feeling irritable, anxious, or depressed. How is this treated? Treatment for insomnia depends on the cause. If your insomnia is caused by an underlying condition, treatment will focus on addressing the condition. Treatment may also include:  Medicines to help you sleep.  Counseling or therapy.  Lifestyle adjustments. Follow these instructions at home:  Take medicines only as directed by your health care provider.  Keep regular sleeping and waking hours. Avoid naps.  Keep a sleep diary to help you and your health care provider figure out what could be causing your insomnia. Include:  When you sleep.  When you wake up during the night.  How well you sleep.  How rested you feel the next day.  Any side effects of medicines you are taking.  What you eat and drink.  Make your bedroom a comfortable place where it is easy to fall asleep:  Put up shades or special blackout curtains to block light  from outside.  Use a white noise machine to block noise.  Keep the temperature cool.  Exercise regularly as directed by your health care provider. Avoid exercising right before bedtime.  Use relaxation techniques to manage stress. Ask your health care provider to suggest some techniques that may work well for you. These may include:  Breathing exercises.  Routines to release muscle tension.  Visualizing peaceful scenes.  Cut back on alcohol, caffeinated beverages, and cigarettes, especially close to bedtime. These can disrupt your sleep.  Do not overeat or eat spicy foods right before bedtime. This can lead to digestive discomfort that can make it hard for  you to sleep.  Limit screen use before bedtime. This includes:  Watching TV.  Using your smartphone, tablet, and computer.  Stick to a routine. This can help you fall asleep faster. Try to do a quiet activity, brush your teeth, and go to bed at the same time each night.  Get out of bed if you are still awake after 15 minutes of trying to sleep. Keep the lights down, but try reading or doing a quiet activity. When you feel sleepy, go back to bed.  Make sure that you drive carefully. Avoid driving if you feel very sleepy.  Keep all follow-up appointments as directed by your health care provider. This is important. Contact a health care provider if:  You are tired throughout the day or have trouble in your daily routine due to sleepiness.  You continue to have sleep problems or your sleep problems get worse. Get help right away if:  You have serious thoughts about hurting yourself or someone else. This information is not intended to replace advice given to you by your health care provider. Make sure you discuss any questions you have with your health care provider. Document Released: 07/07/2000 Document Revised: 12/10/2015 Document Reviewed: 04/10/2014 Elsevier Interactive Patient Education  2017 [employer].

## 2016-11-09 ENCOUNTER — Telehealth: Payer: Self-pay | Admitting: Endocrinology

## 2016-11-09 MED ORDER — TRAZODONE HCL 50 MG PO TABS: 25.0000 mg | ORAL_TABLET | Freq: Every evening | ORAL | 3 refills | Status: DC | PRN

## 2016-11-09 NOTE — Telephone Encounter (Signed)
Facility and patient notified of med change.

## 2016-11-09 NOTE — Telephone Encounter (Signed)
please call patient's facility: Ins wants to change zolpidem to trazodone.  Here is a prescription.

## 2016-11-17 ENCOUNTER — Ambulatory Visit: Payer: Medicare HMO | Admitting: Endocrinology

## 2016-12-01 ENCOUNTER — Ambulatory Visit (INDEPENDENT_AMBULATORY_CARE_PROVIDER_SITE_OTHER): Payer: Medicare HMO | Admitting: Endocrinology

## 2016-12-01 ENCOUNTER — Ambulatory Visit
Admission: RE | Admit: 2016-12-01 | Discharge: 2016-12-01 | Disposition: A | Discharge status: Home / Self Care | Payer: Medicare HMO | Source: Ambulatory Visit | Attending: Endocrinology | Admitting: Endocrinology

## 2016-12-01 ENCOUNTER — Encounter: Payer: Self-pay | Admitting: Endocrinology

## 2016-12-01 ENCOUNTER — Ambulatory Visit: Payer: Medicare HMO | Admitting: Endocrinology

## 2016-12-01 VITALS — BP 152/78 | HR 80 | Ht 63.0 in | Wt 150.8 lb

## 2016-12-01 DIAGNOSIS — S92301A Fracture of unspecified metatarsal bone(s), right foot, initial encounter for closed fracture: Secondary | ICD-10-CM | POA: Diagnosis not present

## 2016-12-01 DIAGNOSIS — S9031XA Contusion of right foot, initial encounter: Secondary | ICD-10-CM

## 2016-12-01 DIAGNOSIS — S92354A Nondisplaced fracture of fifth metatarsal bone, right foot, initial encounter for closed fracture: Secondary | ICD-10-CM | POA: Diagnosis not present

## 2016-12-01 DIAGNOSIS — S92309A Fracture of unspecified metatarsal bone(s), unspecified foot, initial encounter for closed fracture: Secondary | ICD-10-CM | POA: Insufficient documentation

## 2016-12-01 DIAGNOSIS — R2681 Unsteadiness on feet: Secondary | ICD-10-CM | POA: Diagnosis not present

## 2016-12-01 MED ORDER — TRAZODONE HCL 50 MG PO TABS: 75.0000 mg | ORAL_TABLET | Freq: Every evening | ORAL | 3 refills | Status: AC | PRN

## 2016-12-01 NOTE — Patient Instructions (Addendum)
X-rays are requested for you today.  We'll let you know about the results.  It is critically important to prevent falling down (keep floor areas well-lit, dry, and free of loose objects.  If you have a cane, walker, or wheelchair, you should use it, even for short trips around the house.  Wear flat-soled shoes.  Also, try not to rush).   Please increase trazodone 75 mg at bedtime.   I have requested physical therapy for you.  Please come back for a follow-up appointment in 4 months.

## 2016-12-01 NOTE — Progress Notes (Signed)
Subjective:    Patient ID: Autumn Galloway, female    DOB: 1931-01-26, 83 y.o.   MRN: [id number]  HPI Pt states of slight dizziness sensation in the head, and assoc pain at the back of the neck.  Yesterday, she fell on her left upper arm and right foot.   This happens when she was walking on stairs at home, using a cane.  Past Medical History:  Diagnosis Date  . Apical lung scarring    Chronic  . BREAST CANCER, HX OF 02/28/2007  . CAROTID ARTERY STENOSIS 06/13/2010  . DIVERTICULOSIS OF COLON 09/09/2003  . DIZZINESS OR VERTIGO 02/28/2007  . ESOPHAGEAL STRICTURE 05/03/1998  . GERD 05/03/1998  . Hearing loss   . HYPERLIPIDEMIA 02/28/2007  . HYPERTENSION 02/28/2007  . INTERNAL HEMORRHOIDS 09/09/2003  . Internal hemorrhoids   . OSTEOPOROSIS 06/13/2010  . RENAL INSUFFICIENCY 12/25/2008  . Scoliosis   . THROMBOCYTOPENIA 12/25/2008  . Vitamin D deficiency     Past Surgical History:  Procedure Laterality Date  . CARPAL TUNNEL RELEASE     bilateral  . ELECTROCARDIOGRAM  05/02/2005  . HEMORRHOID BANDING    . MASTECTOMY  09/2003   left  . TONSILLECTOMY    . TUBAL LIGATION  1989  . US ECHOCARDIOGRAPHY  05/03/2004    Social History   Social History  . Marital status: Widowed    Spouse name: N/A  . Number of children: 4  . Years of education: N/A   Occupational History  . Retired    Social History Main Topics  . Smoking status: Never Smoker  . Smokeless tobacco: Never Used  . Alcohol use No  . Drug use: No  . Sexual activity: Not on file   Other Topics Concern  . Not on file   Social History Narrative  . No narrative on file    Current Outpatient Prescriptions on File Prior to Visit  Medication Sig Dispense Refill  . aspirin 81 MG tablet Take 81 mg by mouth daily.      . Calcium Carbonate (CALTRATE 600 PO) Take by mouth.    . losartan (COZAAR) 25 MG tablet Take 1 tablet (25 mg total) by mouth daily. 90 tablet 1  . Multiple Vitamins-Minerals (CENTRUM SILVER PO) Take 1 tablet by  mouth daily.      . simvastatin (ZOCOR) 20 MG tablet TAKE ONE TABLET BY MOUTH AT BEDTIME 90 tablet 0   No current facility-administered medications on file prior to visit.     Allergies  Allergen Reactions  . Lisinopril     REACTION: Cough    Family History  Problem Relation Age of Onset  . Breast cancer Sister        x 2  . Breast cancer Maternal Aunt   . Breast cancer Other        nephew    BP (!) 152/78 (BP Location: Right Arm, Patient Position: Sitting, Cuff Size: Normal)   Pulse 80   Ht 5\' 3"  (1.6 m)   Wt 150 lb 12.8 oz (68.4 kg)   SpO2 96%   BMI 26.71 kg/m    Review of Systems Denies LOC and headache.  She has insomnia, despite trazodone 50 qhs.      Objective:   Physical Exam VITAL SIGNS:  See vs page GENERAL: no distress Left upper arm: no deformity.  2 ecchymoses (each 3 cm), but break in the skin Right foot: slight swelling and tenderness at the instep, but no break in the  skin. Gait: steady with a walker.     MRI: No specific or age advanced atrophy to explain memory loss.  Moderate chronic small vessel disease. 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 Remote hemorrhagic foci in the lower cerebellum may be from related remote trauma.   Carotid dopplers (2015): mild stenosis  Right foot: new fx.     Assessment & Plan:  dizziness, chronic, uncertain reason for worsening.   Insomnia: she needs increased rx.  Foot fx, new.  Ref ortho.    Patient Instructions  X-rays are requested for you today.  We'll let you know about the results.  It is critically important to prevent falling down (keep floor areas well-lit, dry, and free of loose objects.  If you have a cane, walker, or wheelchair, you should use it, even for short trips around the house.  Wear flat-soled shoes.  Also, try not to rush).   Please increase trazodone 75 mg at bedtime.   I have requested  physical therapy for you.  Please come back for a follow-up appointment in 4 months.

## 2016-12-06 ENCOUNTER — Telehealth: Payer: Self-pay

## 2016-12-06 NOTE — Telephone Encounter (Signed)
Patient called wanting to know if we could formulate a letter stating she can take her medication because she will be leaving [facility] and going to an assisted living facility. Pateint stated she will get 200$ back from [facility] if the letter could be sent.  Please advise, Thanks!

## 2016-12-06 NOTE — Telephone Encounter (Signed)
I printed  

## 2016-12-07 NOTE — Telephone Encounter (Signed)
Letter submitted

## 2016-12-07 NOTE — Telephone Encounter (Signed)
Patient notified

## 2016-12-11 ENCOUNTER — Encounter (INDEPENDENT_AMBULATORY_CARE_PROVIDER_SITE_OTHER): Payer: Self-pay | Admitting: Orthopedic Surgery

## 2016-12-11 ENCOUNTER — Ambulatory Visit (INDEPENDENT_AMBULATORY_CARE_PROVIDER_SITE_OTHER): Payer: Medicare HMO | Admitting: Orthopedic Surgery

## 2016-12-11 VITALS — Ht 63.0 in | Wt 150.0 lb

## 2016-12-11 DIAGNOSIS — M84374A Stress fracture, right foot, initial encounter for fracture: Secondary | ICD-10-CM | POA: Diagnosis not present

## 2016-12-11 NOTE — Progress Notes (Signed)
Office Visit Note   Patient: Autumn Galloway           Date of Birth: [birth year]           MRN: [id number] Visit Date: 12/11/2016              Requested by: Renato Shin, MD [address] [place], [place] [address] PCP: Renato Shin, MD  Chief Complaint  Patient presents with  . Right Foot - Pain    S/p fall 11/30/16 nondisplaced fx base 5th MT right foot.       HPI:  patient is an 83 year old woman resident of assisted living who states that she lost her balance and when she fell she sustained an injury to the fifth metatarsal right foot radiographs were obtained and patient is seen today for initial evaluation. Patient is currently wearing a thin soft pair of flip-flops.  Assessment & Plan: Visit Diagnoses:  1. Fracture, stress, metatarsal, right, initial encounter     Plan:  Patient will be given a postoperative shoe she will wear this for a month and then wean out of the postoperative shoe as she feels comfortable.  Follow-Up Instructions: Return if symptoms worsen or fail to improve.   Ortho Exam  Patient is alert, oriented, no adenopathy, well-dressed, normal affect, normal respiratory effort.  examination patient has good pulses she has good ankle and subtalar motion there are no ulcers no skin breakdown there is no ecchymosis no bruising. Patient is tender to palpation over the base of the fifth metatarsal. Radiographs were reviewed and show a nondisplaced metaphyseal fracture base of the fifth metatarsal right foot.  Imaging: No results found.  Labs: Lab Results  Component Value Date   HGBA1C 5.5 05/03/2006   ESRSEDRATE 10 10/10/2016   ESRSEDRATE 9 02/13/2012   ESRSEDRATE 9 06/22/2006    Orders:  No orders of the defined types were placed in this encounter.  No orders of the defined types were placed in this encounter.    Procedures: No procedures performed  Clinical Data: No additional findings.  ROS:  All other systems negative,  except as noted in the HPI. Review of Systems  Objective: Vital Signs: Ht 5\' 3"  (1.6 m)   Wt 150 lb (68 kg)   BMI 26.57 kg/m   Specialty Comments:  No specialty comments available.  PMFS History: Patient Active Problem List   Diagnosis Date Noted  . Fracture, stress, metatarsal, right, initial encounter 12/11/2016  . Unsteady gait 12/01/2016  . Metatarsal fracture 12/01/2016  . Contusion of foot, right 11/07/2016  . Headache 10/10/2016  . Mild cognitive impairment with memory loss 08/20/2016  . Iron deficiency anemia 02/17/2016  . Wheezing 10/29/2015  . Restrictive lung disease 10/29/2015  . Genetic testing 02/04/2015  . Incontinence of urine 01/28/2015  . Neck pain 09/15/2014  . Wellness examination 08/19/2014  . Pain of right heel 08/19/2014  . Routine general medical examination at a health care facility 08/07/2013  . Vitamin D deficiency 07/07/2013  . Encounter for long-term (current) use of other medications 07/01/2012  . Hearing loss 06/21/2011  . CAROTID ARTERY STENOSIS 06/13/2010  . Osteoporosis 06/13/2010  . HIP PAIN, RIGHT 04/15/2009  . Thrombocytopenia ([place]) 12/25/2008  . Disorder resulting from impaired renal function 12/25/2008  . COUGH 12/25/2008  . Dyslipidemia 02/28/2007  . Essential hypertension 02/28/2007  . DIZZINESS OR VERTIGO 02/28/2007  . BREAST CANCER, HX OF 02/28/2007  . Internal hemorrhoids 09/09/2003  . DIVERTICULOSIS OF COLON 09/09/2003  .  ESOPHAGEAL STRICTURE 05/03/1998  . GERD 05/03/1998   Past Medical History:  Diagnosis Date  . Apical lung scarring    Chronic  . BREAST CANCER, HX OF 02/28/2007  . CAROTID ARTERY STENOSIS 06/13/2010  . DIVERTICULOSIS OF COLON 09/09/2003  . DIZZINESS OR VERTIGO 02/28/2007  . ESOPHAGEAL STRICTURE 05/03/1998  . GERD 05/03/1998  . Hearing loss   . HYPERLIPIDEMIA 02/28/2007  . HYPERTENSION 02/28/2007  . INTERNAL HEMORRHOIDS 09/09/2003  . Internal hemorrhoids   . OSTEOPOROSIS 06/13/2010  . RENAL  INSUFFICIENCY 12/25/2008  . Scoliosis   . THROMBOCYTOPENIA 12/25/2008  . Vitamin D deficiency     Family History  Problem Relation Age of Onset  . Breast cancer Sister        x 2  . Breast cancer Maternal Aunt   . Breast cancer Other        nephew    Past Surgical History:  Procedure Laterality Date  . CARPAL TUNNEL RELEASE     bilateral  . ELECTROCARDIOGRAM  05/02/2005  . HEMORRHOID BANDING    . MASTECTOMY  09/2003   left  . TONSILLECTOMY    . TUBAL LIGATION  1989  . US ECHOCARDIOGRAPHY  05/03/2004   Social History   Occupational History  . Retired    Social History Main Topics  . Smoking status: Never Smoker  . Smokeless tobacco: Never Used  . Alcohol use No  . Drug use: No  . Sexual activity: Not on file

## 2016-12-14 ENCOUNTER — Ambulatory Visit (INDEPENDENT_AMBULATORY_CARE_PROVIDER_SITE_OTHER): Payer: Medicare HMO | Admitting: Endocrinology

## 2016-12-14 ENCOUNTER — Encounter: Payer: Self-pay | Admitting: Endocrinology

## 2016-12-14 DIAGNOSIS — K625 Hemorrhage of anus and rectum: Secondary | ICD-10-CM

## 2016-12-14 MED ORDER — HYDROCORTISONE 2.5 % RE CREA: 1.0000 "application " | TOPICAL_CREAM | Freq: Two times a day (BID) | RECTAL | 0 refills | Status: AC

## 2016-12-14 NOTE — Progress Notes (Signed)
Subjective:    Patient ID: Autumn Galloway, female    DOB: [birth year], 83 y.o.   MRN: [id number]  HPI Pt states 1 days of moderate bleeding from the rectum, but no assoc pain.  Past Medical History:  Diagnosis Date  . Apical lung scarring    Chronic  . BREAST CANCER, HX OF 02/28/2007  . CAROTID ARTERY STENOSIS 06/13/2010  . DIVERTICULOSIS OF COLON 09/09/2003  . DIZZINESS OR VERTIGO 02/28/2007  . ESOPHAGEAL STRICTURE 05/03/1998  . GERD 05/03/1998  . Hearing loss   . HYPERLIPIDEMIA 02/28/2007  . HYPERTENSION 02/28/2007  . INTERNAL HEMORRHOIDS 09/09/2003  . Internal hemorrhoids   . OSTEOPOROSIS 06/13/2010  . RENAL INSUFFICIENCY 12/25/2008  . Scoliosis   . THROMBOCYTOPENIA 12/25/2008  . Vitamin D deficiency     Past Surgical History:  Procedure Laterality Date  . CARPAL TUNNEL RELEASE     bilateral  . ELECTROCARDIOGRAM  05/02/2005  . HEMORRHOID BANDING    . MASTECTOMY  09/2003   left  . TONSILLECTOMY    . TUBAL LIGATION  1989  . US ECHOCARDIOGRAPHY  05/03/2004    Social History   Social History  . Marital status: Widowed    Spouse name: N/A  . Number of children: 4  . Years of education: N/A   Occupational History  . Retired    Social History Main Topics  . Smoking status: Never Smoker  . Smokeless tobacco: Never Used  . Alcohol use No  . Drug use: No  . Sexual activity: Not on file   Other Topics Concern  . Not on file   Social History Narrative  . No narrative on file    Current Outpatient Prescriptions on File Prior to Visit  Medication Sig Dispense Refill  . aspirin 81 MG tablet Take 81 mg by mouth daily.      . Calcium Carbonate (CALTRATE 600 PO) Take by mouth.    . losartan (COZAAR) 25 MG tablet Take 1 tablet (25 mg total) by mouth daily. 90 tablet 1  . Multiple Vitamins-Minerals (CENTRUM SILVER PO) Take 1 tablet by mouth daily.      . simvastatin (ZOCOR) 20 MG tablet TAKE ONE TABLET BY MOUTH AT BEDTIME 90 tablet 0  . traZODone (DESYREL) 50 MG tablet Take  1.5 tablets (75 mg total) by mouth at bedtime as needed for sleep. 45 tablet 3   No current facility-administered medications on file prior to visit.     Allergies  Allergen Reactions  . Lisinopril     REACTION: Cough    Family History  Problem Relation Age of Onset  . Breast cancer Sister        x 2  . Breast cancer Maternal Aunt   . Breast cancer Other        nephew    BP (!) 148/84   Pulse 81   Ht 5\' 3"  (1.6 m)   Wt 151 lb (68.5 kg)   SpO2 96%   BMI 26.75 kg/m    Review of Systems Denies abd pain and weight loss.     Objective:   Physical Exam VITAL SIGNS:  See vs page.  GENERAL: no distress.   Rectal: slight irritation and 1 cm hemorrhoid are seen.        Assessment & Plan:  Hemorrhoidal bleeding, new. HTN: poss situational component.  Patient Instructions  Please continue the same medication for blood pressure.  blood tests are requested for you today.  We'll let you know about  the results. I have sent a prescription to your pharmacy, for a cream to apply to the area. Take a stool softener 1 daily.   I hope you feel better soon.  If you don't feel better by next week, please call back.  Please call sooner if you get worse.

## 2016-12-14 NOTE — Patient Instructions (Addendum)
Please continue the same medication for blood pressure.  blood tests are requested for you today.  We'll let you know about the results. I have sent a prescription to your pharmacy, for a cream to apply to the area. Take a stool softener 1 daily.   I hope you feel better soon.  If you don't feel better by next week, please call back.  Please call sooner if you get worse.

## 2016-12-15 ENCOUNTER — Telehealth: Payer: Self-pay

## 2016-12-15 NOTE — Telephone Encounter (Signed)
Patient is on the list for [employer] 2018 and may be a good candidate for an AWV. Could you have Dr. Loanne Drilling do an AWV at her next visit and let me know if/when appt is scheduled.

## 2017-01-02 ENCOUNTER — Telehealth: Payer: Self-pay | Admitting: Endocrinology

## 2017-01-02 MED ORDER — SIMVASTATIN 20 MG PO TABS: 20.0000 mg | ORAL_TABLET | Freq: Every day | ORAL | 1 refills | Status: AC

## 2017-01-02 MED ORDER — LOSARTAN POTASSIUM 25 MG PO TABS: 25.0000 mg | ORAL_TABLET | Freq: Every day | ORAL | 1 refills | Status: AC

## 2017-01-02 NOTE — Telephone Encounter (Signed)
Refills submitted.  

## 2017-01-02 NOTE — Telephone Encounter (Signed)
**  Remind patient they can make refill requests via MyChart**  Medication refill request (Name & Dosage):  simvastatin (ZOCOR) 20 MG tablet losartan (COZAAR) 25 MG tablet  Preferred pharmacy (Name & Address):           [facility], [place]   Other comments (if applicable):

## 2017-01-29 DIAGNOSIS — D539 Nutritional anemia, unspecified: Secondary | ICD-10-CM | POA: Diagnosis not present

## 2017-01-29 DIAGNOSIS — E559 Vitamin D deficiency, unspecified: Secondary | ICD-10-CM | POA: Diagnosis not present

## 2017-01-29 DIAGNOSIS — D638 Anemia in other chronic diseases classified elsewhere: Secondary | ICD-10-CM | POA: Diagnosis not present

## 2017-01-29 DIAGNOSIS — E611 Iron deficiency: Secondary | ICD-10-CM | POA: Diagnosis not present

## 2017-01-29 DIAGNOSIS — N182 Chronic kidney disease, stage 2 (mild): Secondary | ICD-10-CM | POA: Diagnosis not present

## 2017-02-06 DIAGNOSIS — E782 Mixed hyperlipidemia: Secondary | ICD-10-CM | POA: Diagnosis not present

## 2017-02-06 DIAGNOSIS — E611 Iron deficiency: Secondary | ICD-10-CM | POA: Diagnosis not present

## 2017-02-06 DIAGNOSIS — R0602 Shortness of breath: Secondary | ICD-10-CM | POA: Diagnosis not present

## 2017-02-06 DIAGNOSIS — E038 Other specified hypothyroidism: Secondary | ICD-10-CM | POA: Diagnosis not present

## 2017-02-06 DIAGNOSIS — R0689 Other abnormalities of breathing: Secondary | ICD-10-CM | POA: Diagnosis not present

## 2017-02-06 DIAGNOSIS — R7301 Impaired fasting glucose: Secondary | ICD-10-CM | POA: Diagnosis not present

## 2017-02-06 DIAGNOSIS — I10 Essential (primary) hypertension: Secondary | ICD-10-CM | POA: Diagnosis not present

## 2017-02-06 DIAGNOSIS — N182 Chronic kidney disease, stage 2 (mild): Secondary | ICD-10-CM | POA: Diagnosis not present

## 2017-02-06 DIAGNOSIS — E569 Vitamin deficiency, unspecified: Secondary | ICD-10-CM | POA: Diagnosis not present

## 2017-03-09 ENCOUNTER — Ambulatory Visit: Payer: Medicare HMO | Admitting: Endocrinology

## 2017-03-19 DIAGNOSIS — M85832 Other specified disorders of bone density and structure, left forearm: Secondary | ICD-10-CM | POA: Diagnosis not present

## 2017-03-19 DIAGNOSIS — M8589 Other specified disorders of bone density and structure, multiple sites: Secondary | ICD-10-CM | POA: Diagnosis not present

## 2017-03-20 DIAGNOSIS — Z961 Presence of intraocular lens: Secondary | ICD-10-CM | POA: Diagnosis not present

## 2017-03-20 DIAGNOSIS — Z1211 Encounter for screening for malignant neoplasm of colon: Secondary | ICD-10-CM | POA: Diagnosis not present

## 2017-03-20 DIAGNOSIS — Z1212 Encounter for screening for malignant neoplasm of rectum: Secondary | ICD-10-CM | POA: Diagnosis not present

## 2017-03-21 DIAGNOSIS — R69 Illness, unspecified: Secondary | ICD-10-CM | POA: Diagnosis not present

## 2017-03-21 DIAGNOSIS — R296 Repeated falls: Secondary | ICD-10-CM | POA: Diagnosis not present

## 2017-03-21 DIAGNOSIS — E782 Mixed hyperlipidemia: Secondary | ICD-10-CM | POA: Diagnosis not present

## 2017-03-21 DIAGNOSIS — I10 Essential (primary) hypertension: Secondary | ICD-10-CM | POA: Diagnosis not present

## 2017-03-21 DIAGNOSIS — R42 Dizziness and giddiness: Secondary | ICD-10-CM | POA: Diagnosis not present

## 2017-03-21 DIAGNOSIS — R601 Generalized edema: Secondary | ICD-10-CM | POA: Diagnosis not present

## 2017-03-21 DIAGNOSIS — E611 Iron deficiency: Secondary | ICD-10-CM | POA: Diagnosis not present

## 2017-03-21 DIAGNOSIS — R2689 Other abnormalities of gait and mobility: Secondary | ICD-10-CM | POA: Diagnosis not present

## 2017-03-22 DIAGNOSIS — I739 Peripheral vascular disease, unspecified: Secondary | ICD-10-CM | POA: Diagnosis not present

## 2017-03-22 DIAGNOSIS — D649 Anemia, unspecified: Secondary | ICD-10-CM | POA: Diagnosis not present

## 2017-03-22 DIAGNOSIS — G4700 Insomnia, unspecified: Secondary | ICD-10-CM | POA: Diagnosis not present

## 2017-03-22 DIAGNOSIS — I10 Essential (primary) hypertension: Secondary | ICD-10-CM | POA: Diagnosis not present

## 2017-03-22 DIAGNOSIS — Z7982 Long term (current) use of aspirin: Secondary | ICD-10-CM | POA: Diagnosis not present

## 2017-03-22 DIAGNOSIS — R69 Illness, unspecified: Secondary | ICD-10-CM | POA: Diagnosis not present

## 2017-03-22 DIAGNOSIS — E782 Mixed hyperlipidemia: Secondary | ICD-10-CM | POA: Diagnosis not present

## 2017-03-22 DIAGNOSIS — N182 Chronic kidney disease, stage 2 (mild): Secondary | ICD-10-CM | POA: Diagnosis not present

## 2017-03-22 DIAGNOSIS — Z853 Personal history of malignant neoplasm of breast: Secondary | ICD-10-CM | POA: Diagnosis not present

## 2017-03-22 DIAGNOSIS — I129 Hypertensive chronic kidney disease with stage 1 through stage 4 chronic kidney disease, or unspecified chronic kidney disease: Secondary | ICD-10-CM | POA: Diagnosis not present

## 2017-03-22 DIAGNOSIS — N3281 Overactive bladder: Secondary | ICD-10-CM | POA: Diagnosis not present

## 2017-03-23 ENCOUNTER — Encounter: Payer: Self-pay | Admitting: Pulmonary Disease

## 2017-03-23 ENCOUNTER — Ambulatory Visit (INDEPENDENT_AMBULATORY_CARE_PROVIDER_SITE_OTHER): Payer: Medicare HMO | Admitting: Pulmonary Disease

## 2017-03-23 VITALS — BP 110/60 | HR 76 | Ht 64.0 in | Wt 148.0 lb

## 2017-03-23 DIAGNOSIS — J984 Other disorders of lung: Secondary | ICD-10-CM

## 2017-03-23 NOTE — Patient Instructions (Signed)
You can follow-up with [place] as needed. Please call us if there is any worsening of her breathing and we will reevaluate. In the meantime as per your wishes will defer workup for dyspnea.

## 2017-03-23 NOTE — Progress Notes (Addendum)
Autumn Galloway    [id number]    1930-10-14  Primary Care Physician:Ellison, Hilliard Clark, MD  Referring Physician: Renato Shin, MD [address] [place], [place] [address]  Chief complaint:   Follow-up for dyspnea on exertion. Restrictive lung disease  HPI: Follow-up for dyspnea on exertion. Complains of of heavy breathing when she walks.greater than one block.She states she has had this for about 1 year.Severity is moderate with exertion. It resolves within 5 minutes after sitting down, or resting.She states she has only had wheezing x 1 with these episodes.She also has fatigue with the shortness of breath.She states that the fatigue is more of a chronic problem. She also has chronic anemia.She did have breast cancer 10 years ago and has had left mastectomy. She is no longer under surveillance for her cancer.She has not had any new exposures.There are no birds or animals in the house.She does note new onset left foot  edema.She denies fever, chest pain, purulent secretions,orthopnea, or hemoptysis.No family history of lung disease. She does have a hiatal hernia, and scoliosis.  Interim history: She said limited episode of minor epistaxis earlier today. She does report minimal increase in dyspnea. She is not on any inhalers and is not interested in trying them.  Outpatient Encounter Prescriptions as of 03/23/2017  Medication Sig  . aspirin 81 MG tablet Take 81 mg by mouth daily.    . Calcium Carbonate (CALTRATE 600 PO) Take by mouth.  . donepezil (ARICEPT) 5 MG tablet   . hydrocortisone (ANUSOL-HC) 2.5 % rectal cream Place 1 application rectally 2 (two) times daily.  Marland Kitchen losartan (COZAAR) 25 MG tablet Take 1 tablet (25 mg total) by mouth daily.  . Multiple Vitamins-Minerals (CENTRUM SILVER PO) Take 1 tablet by mouth daily.    . simvastatin (ZOCOR) 20 MG tablet Take 1 tablet (20 mg total) by mouth at bedtime.  . traZODone (DESYREL) 50 MG tablet Take 1.5 tablets (75 mg total)  by mouth at bedtime as needed for sleep.  . [DISCONTINUED] metoprolol succinate (TOPROL-XL) 25 MG 24 hr tablet    No facility-administered encounter medications on file as of 03/23/2017.     Allergies as of 03/23/2017 - Review Complete 03/23/2017  Allergen Reaction Noted  . Lisinopril      Past Medical History:  Diagnosis Date  . Apical lung scarring    Chronic  . BREAST CANCER, HX OF 02/28/2007  . CAROTID ARTERY STENOSIS 06/13/2010  . DIVERTICULOSIS OF COLON 09/09/2003  . DIZZINESS OR VERTIGO 02/28/2007  . ESOPHAGEAL STRICTURE 05/03/1998  . GERD 05/03/1998  . Hearing loss   . HYPERLIPIDEMIA 02/28/2007  . HYPERTENSION 02/28/2007  . INTERNAL HEMORRHOIDS 09/09/2003  . Internal hemorrhoids   . OSTEOPOROSIS 06/13/2010  . RENAL INSUFFICIENCY 12/25/2008  . Scoliosis   . THROMBOCYTOPENIA 12/25/2008  . Vitamin D deficiency     Past Surgical History:  Procedure Laterality Date  . CARPAL TUNNEL RELEASE     bilateral  . ELECTROCARDIOGRAM  05/02/2005  . HEMORRHOID BANDING    . MASTECTOMY  09/2003   left  . TONSILLECTOMY    . TUBAL LIGATION  1989  . US ECHOCARDIOGRAPHY  05/03/2004    Family History  Problem Relation Age of Onset  . Breast cancer Sister        x 2  . Breast cancer Maternal Aunt   . Breast cancer Other        nephew    Social History  Social History  . Marital status: Widowed    Spouse name: N/A  . Number of children: 4  . Years of education: N/A   Occupational History  . Retired    Social History Main Topics  . Smoking status: Never Smoker  . Smokeless tobacco: Never Used  . Alcohol use No  . Drug use: No  . Sexual activity: Not on file   Other Topics Concern  . Not on file   Social History Narrative  . No narrative on file     Review of systems: Review of Systems  Constitutional: Negative for fever and chills.  HENT: Negative.   Eyes: Negative for blurred vision.  Respiratory: as per HPI  Cardiovascular: Negative for chest pain and  palpitations.  Gastrointestinal: Negative for vomiting, diarrhea, blood per rectum. Genitourinary: Negative for dysuria, urgency, frequency and hematuria.  Musculoskeletal: Negative for myalgias, back pain and joint pain.  Skin: Negative for itching and rash.  Neurological: Negative for dizziness, tremors, focal weakness, seizures and loss of consciousness.  Endo/Heme/Allergies: Negative for environmental allergies.  Psychiatric/Behavioral: Negative for depression, suicidal ideas and hallucinations.  All other systems reviewed and are negative.   Physical Exam: Blood pressure 110/60, pulse 76, height 5\' 4"  (1.626 m), weight 148 lb (67.1 kg), SpO2 99 %. Gen:      No acute distress HEENT:  EOMI, sclera anicteric Neck:     No masses; no thyromegaly Lungs:    Clear to auscultation bilaterally; normal respiratory effort CV:         Regular rate and rhythm; no murmurs Abd:      + bowel sounds; soft, non-tender; no palpable masses, no distension Ext:    No edema; adequate peripheral perfusion Skin:      Warm and dry; no rash Neuro: alert and oriented x 3 Psych: normal mood and affect  Data Reviewed: PFTs 03/23/16 FVC 1.8 (55%)  FEV1 1.05 (60%) F/F 80 TLC 63% DLCO 55% Moderate restriction with moderate reduction in diffusion capacity.  Chest x-ray 10/29/15- hyperinflation, marked scoliosis, apical pleural thickening. No acute process. I reviewed all images personally Assessment:  Ms Gebert has been referred for evaluation of dyspnea, restrictive process on PFTs. She feels that her symptoms are manageable and does not need inhalers. I suspect that her restriction is secondary to the hiatal hernia and scoliosis. She is a never smoker and has no exposures.  ILD is a consideration but she would not want biopsy or aggressive treatment even if confirmed. Since she has declined further workup she can return to pulmonary clinic as needed.  I have instructed her to call us back if there is any  worsening of symptoms and if she wants to pursue further testing such as CT of the chest.  Plan/Recommendations: - Return as needed.   Marshell Garfinkel MD [facility] Pager [phone] 03/23/2017, 2:07 PM  CC: Renato Shin, MD

## 2017-03-27 DIAGNOSIS — E782 Mixed hyperlipidemia: Secondary | ICD-10-CM | POA: Diagnosis not present

## 2017-03-27 DIAGNOSIS — N3281 Overactive bladder: Secondary | ICD-10-CM | POA: Diagnosis not present

## 2017-03-27 DIAGNOSIS — Z7982 Long term (current) use of aspirin: Secondary | ICD-10-CM | POA: Diagnosis not present

## 2017-03-27 DIAGNOSIS — G4700 Insomnia, unspecified: Secondary | ICD-10-CM | POA: Diagnosis not present

## 2017-03-27 DIAGNOSIS — I129 Hypertensive chronic kidney disease with stage 1 through stage 4 chronic kidney disease, or unspecified chronic kidney disease: Secondary | ICD-10-CM | POA: Diagnosis not present

## 2017-03-27 DIAGNOSIS — I739 Peripheral vascular disease, unspecified: Secondary | ICD-10-CM | POA: Diagnosis not present

## 2017-03-27 DIAGNOSIS — N182 Chronic kidney disease, stage 2 (mild): Secondary | ICD-10-CM | POA: Diagnosis not present

## 2017-03-27 DIAGNOSIS — R69 Illness, unspecified: Secondary | ICD-10-CM | POA: Diagnosis not present

## 2017-03-27 DIAGNOSIS — D649 Anemia, unspecified: Secondary | ICD-10-CM | POA: Diagnosis not present

## 2017-03-27 DIAGNOSIS — Z853 Personal history of malignant neoplasm of breast: Secondary | ICD-10-CM | POA: Diagnosis not present

## 2017-03-29 DIAGNOSIS — I129 Hypertensive chronic kidney disease with stage 1 through stage 4 chronic kidney disease, or unspecified chronic kidney disease: Secondary | ICD-10-CM | POA: Diagnosis not present

## 2017-03-29 DIAGNOSIS — E782 Mixed hyperlipidemia: Secondary | ICD-10-CM | POA: Diagnosis not present

## 2017-03-29 DIAGNOSIS — G4700 Insomnia, unspecified: Secondary | ICD-10-CM | POA: Diagnosis not present

## 2017-03-29 DIAGNOSIS — Z7982 Long term (current) use of aspirin: Secondary | ICD-10-CM | POA: Diagnosis not present

## 2017-03-29 DIAGNOSIS — R69 Illness, unspecified: Secondary | ICD-10-CM | POA: Diagnosis not present

## 2017-03-29 DIAGNOSIS — N3281 Overactive bladder: Secondary | ICD-10-CM | POA: Diagnosis not present

## 2017-03-29 DIAGNOSIS — N182 Chronic kidney disease, stage 2 (mild): Secondary | ICD-10-CM | POA: Diagnosis not present

## 2017-03-29 DIAGNOSIS — Z853 Personal history of malignant neoplasm of breast: Secondary | ICD-10-CM | POA: Diagnosis not present

## 2017-03-29 DIAGNOSIS — I739 Peripheral vascular disease, unspecified: Secondary | ICD-10-CM | POA: Diagnosis not present

## 2017-03-29 DIAGNOSIS — D649 Anemia, unspecified: Secondary | ICD-10-CM | POA: Diagnosis not present

## 2017-03-30 DIAGNOSIS — I129 Hypertensive chronic kidney disease with stage 1 through stage 4 chronic kidney disease, or unspecified chronic kidney disease: Secondary | ICD-10-CM | POA: Diagnosis not present

## 2017-03-30 DIAGNOSIS — Z853 Personal history of malignant neoplasm of breast: Secondary | ICD-10-CM | POA: Diagnosis not present

## 2017-03-30 DIAGNOSIS — R69 Illness, unspecified: Secondary | ICD-10-CM | POA: Diagnosis not present

## 2017-03-30 DIAGNOSIS — D649 Anemia, unspecified: Secondary | ICD-10-CM | POA: Diagnosis not present

## 2017-03-30 DIAGNOSIS — E782 Mixed hyperlipidemia: Secondary | ICD-10-CM | POA: Diagnosis not present

## 2017-03-30 DIAGNOSIS — I739 Peripheral vascular disease, unspecified: Secondary | ICD-10-CM | POA: Diagnosis not present

## 2017-03-30 DIAGNOSIS — N182 Chronic kidney disease, stage 2 (mild): Secondary | ICD-10-CM | POA: Diagnosis not present

## 2017-03-30 DIAGNOSIS — G4700 Insomnia, unspecified: Secondary | ICD-10-CM | POA: Diagnosis not present

## 2017-03-30 DIAGNOSIS — N3281 Overactive bladder: Secondary | ICD-10-CM | POA: Diagnosis not present

## 2017-03-30 DIAGNOSIS — Z7982 Long term (current) use of aspirin: Secondary | ICD-10-CM | POA: Diagnosis not present

## 2017-04-03 DIAGNOSIS — I739 Peripheral vascular disease, unspecified: Secondary | ICD-10-CM | POA: Diagnosis not present

## 2017-04-03 DIAGNOSIS — E782 Mixed hyperlipidemia: Secondary | ICD-10-CM | POA: Diagnosis not present

## 2017-04-03 DIAGNOSIS — Z7982 Long term (current) use of aspirin: Secondary | ICD-10-CM | POA: Diagnosis not present

## 2017-04-03 DIAGNOSIS — Z853 Personal history of malignant neoplasm of breast: Secondary | ICD-10-CM | POA: Diagnosis not present

## 2017-04-03 DIAGNOSIS — R69 Illness, unspecified: Secondary | ICD-10-CM | POA: Diagnosis not present

## 2017-04-03 DIAGNOSIS — D649 Anemia, unspecified: Secondary | ICD-10-CM | POA: Diagnosis not present

## 2017-04-03 DIAGNOSIS — N182 Chronic kidney disease, stage 2 (mild): Secondary | ICD-10-CM | POA: Diagnosis not present

## 2017-04-03 DIAGNOSIS — G4700 Insomnia, unspecified: Secondary | ICD-10-CM | POA: Diagnosis not present

## 2017-04-03 DIAGNOSIS — N3281 Overactive bladder: Secondary | ICD-10-CM | POA: Diagnosis not present

## 2017-04-03 DIAGNOSIS — I129 Hypertensive chronic kidney disease with stage 1 through stage 4 chronic kidney disease, or unspecified chronic kidney disease: Secondary | ICD-10-CM | POA: Diagnosis not present

## 2017-04-05 DIAGNOSIS — R69 Illness, unspecified: Secondary | ICD-10-CM | POA: Diagnosis not present

## 2017-04-05 DIAGNOSIS — N3281 Overactive bladder: Secondary | ICD-10-CM | POA: Diagnosis not present

## 2017-04-05 DIAGNOSIS — Z853 Personal history of malignant neoplasm of breast: Secondary | ICD-10-CM | POA: Diagnosis not present

## 2017-04-05 DIAGNOSIS — I129 Hypertensive chronic kidney disease with stage 1 through stage 4 chronic kidney disease, or unspecified chronic kidney disease: Secondary | ICD-10-CM | POA: Diagnosis not present

## 2017-04-05 DIAGNOSIS — N182 Chronic kidney disease, stage 2 (mild): Secondary | ICD-10-CM | POA: Diagnosis not present

## 2017-04-05 DIAGNOSIS — Z7982 Long term (current) use of aspirin: Secondary | ICD-10-CM | POA: Diagnosis not present

## 2017-04-05 DIAGNOSIS — G4700 Insomnia, unspecified: Secondary | ICD-10-CM | POA: Diagnosis not present

## 2017-04-05 DIAGNOSIS — D649 Anemia, unspecified: Secondary | ICD-10-CM | POA: Diagnosis not present

## 2017-04-05 DIAGNOSIS — I739 Peripheral vascular disease, unspecified: Secondary | ICD-10-CM | POA: Diagnosis not present

## 2017-04-05 DIAGNOSIS — E782 Mixed hyperlipidemia: Secondary | ICD-10-CM | POA: Diagnosis not present

## 2017-04-06 DIAGNOSIS — I129 Hypertensive chronic kidney disease with stage 1 through stage 4 chronic kidney disease, or unspecified chronic kidney disease: Secondary | ICD-10-CM | POA: Diagnosis not present

## 2017-04-06 DIAGNOSIS — N182 Chronic kidney disease, stage 2 (mild): Secondary | ICD-10-CM | POA: Diagnosis not present

## 2017-04-06 DIAGNOSIS — D649 Anemia, unspecified: Secondary | ICD-10-CM | POA: Diagnosis not present

## 2017-04-06 DIAGNOSIS — R69 Illness, unspecified: Secondary | ICD-10-CM | POA: Diagnosis not present

## 2017-04-10 DIAGNOSIS — D649 Anemia, unspecified: Secondary | ICD-10-CM | POA: Diagnosis not present

## 2017-04-10 DIAGNOSIS — Z853 Personal history of malignant neoplasm of breast: Secondary | ICD-10-CM | POA: Diagnosis not present

## 2017-04-10 DIAGNOSIS — N3281 Overactive bladder: Secondary | ICD-10-CM | POA: Diagnosis not present

## 2017-04-10 DIAGNOSIS — Z7982 Long term (current) use of aspirin: Secondary | ICD-10-CM | POA: Diagnosis not present

## 2017-04-10 DIAGNOSIS — R69 Illness, unspecified: Secondary | ICD-10-CM | POA: Diagnosis not present

## 2017-04-10 DIAGNOSIS — G4700 Insomnia, unspecified: Secondary | ICD-10-CM | POA: Diagnosis not present

## 2017-04-10 DIAGNOSIS — N182 Chronic kidney disease, stage 2 (mild): Secondary | ICD-10-CM | POA: Diagnosis not present

## 2017-04-10 DIAGNOSIS — I739 Peripheral vascular disease, unspecified: Secondary | ICD-10-CM | POA: Diagnosis not present

## 2017-04-10 DIAGNOSIS — I129 Hypertensive chronic kidney disease with stage 1 through stage 4 chronic kidney disease, or unspecified chronic kidney disease: Secondary | ICD-10-CM | POA: Diagnosis not present

## 2017-04-10 DIAGNOSIS — E782 Mixed hyperlipidemia: Secondary | ICD-10-CM | POA: Diagnosis not present

## 2017-04-12 DIAGNOSIS — E782 Mixed hyperlipidemia: Secondary | ICD-10-CM | POA: Diagnosis not present

## 2017-04-12 DIAGNOSIS — I739 Peripheral vascular disease, unspecified: Secondary | ICD-10-CM | POA: Diagnosis not present

## 2017-04-12 DIAGNOSIS — N3281 Overactive bladder: Secondary | ICD-10-CM | POA: Diagnosis not present

## 2017-04-12 DIAGNOSIS — D649 Anemia, unspecified: Secondary | ICD-10-CM | POA: Diagnosis not present

## 2017-04-12 DIAGNOSIS — G4700 Insomnia, unspecified: Secondary | ICD-10-CM | POA: Diagnosis not present

## 2017-04-12 DIAGNOSIS — Z7982 Long term (current) use of aspirin: Secondary | ICD-10-CM | POA: Diagnosis not present

## 2017-04-12 DIAGNOSIS — N182 Chronic kidney disease, stage 2 (mild): Secondary | ICD-10-CM | POA: Diagnosis not present

## 2017-04-12 DIAGNOSIS — Z853 Personal history of malignant neoplasm of breast: Secondary | ICD-10-CM | POA: Diagnosis not present

## 2017-04-12 DIAGNOSIS — I129 Hypertensive chronic kidney disease with stage 1 through stage 4 chronic kidney disease, or unspecified chronic kidney disease: Secondary | ICD-10-CM | POA: Diagnosis not present

## 2017-04-12 DIAGNOSIS — R69 Illness, unspecified: Secondary | ICD-10-CM | POA: Diagnosis not present

## 2017-04-24 DIAGNOSIS — I10 Essential (primary) hypertension: Secondary | ICD-10-CM | POA: Diagnosis not present

## 2017-04-24 DIAGNOSIS — R296 Repeated falls: Secondary | ICD-10-CM | POA: Diagnosis not present

## 2017-04-24 DIAGNOSIS — Z23 Encounter for immunization: Secondary | ICD-10-CM | POA: Diagnosis not present

## 2017-04-24 DIAGNOSIS — I739 Peripheral vascular disease, unspecified: Secondary | ICD-10-CM | POA: Diagnosis not present

## 2017-04-24 DIAGNOSIS — R69 Illness, unspecified: Secondary | ICD-10-CM | POA: Diagnosis not present

## 2017-05-05 DIAGNOSIS — R69 Illness, unspecified: Secondary | ICD-10-CM | POA: Diagnosis not present

## 2017-05-05 DIAGNOSIS — N3281 Overactive bladder: Secondary | ICD-10-CM | POA: Diagnosis not present

## 2017-05-05 DIAGNOSIS — I739 Peripheral vascular disease, unspecified: Secondary | ICD-10-CM | POA: Diagnosis not present

## 2017-05-05 DIAGNOSIS — N182 Chronic kidney disease, stage 2 (mild): Secondary | ICD-10-CM | POA: Diagnosis not present

## 2017-05-05 DIAGNOSIS — Z7982 Long term (current) use of aspirin: Secondary | ICD-10-CM | POA: Diagnosis not present

## 2017-05-05 DIAGNOSIS — Z853 Personal history of malignant neoplasm of breast: Secondary | ICD-10-CM | POA: Diagnosis not present

## 2017-05-05 DIAGNOSIS — D649 Anemia, unspecified: Secondary | ICD-10-CM | POA: Diagnosis not present

## 2017-05-05 DIAGNOSIS — I129 Hypertensive chronic kidney disease with stage 1 through stage 4 chronic kidney disease, or unspecified chronic kidney disease: Secondary | ICD-10-CM | POA: Diagnosis not present

## 2017-05-05 DIAGNOSIS — E782 Mixed hyperlipidemia: Secondary | ICD-10-CM | POA: Diagnosis not present

## 2017-05-05 DIAGNOSIS — G4700 Insomnia, unspecified: Secondary | ICD-10-CM | POA: Diagnosis not present

## 2017-05-07 DIAGNOSIS — R0689 Other abnormalities of breathing: Secondary | ICD-10-CM | POA: Diagnosis not present

## 2017-05-07 DIAGNOSIS — J441 Chronic obstructive pulmonary disease with (acute) exacerbation: Secondary | ICD-10-CM | POA: Diagnosis not present

## 2017-05-07 DIAGNOSIS — R061 Stridor: Secondary | ICD-10-CM | POA: Diagnosis not present

## 2017-05-07 DIAGNOSIS — R159 Full incontinence of feces: Secondary | ICD-10-CM | POA: Diagnosis not present

## 2017-05-07 DIAGNOSIS — R0602 Shortness of breath: Secondary | ICD-10-CM | POA: Diagnosis not present

## 2017-05-07 DIAGNOSIS — J208 Acute bronchitis due to other specified organisms: Secondary | ICD-10-CM | POA: Diagnosis not present

## 2017-05-07 DIAGNOSIS — R5383 Other fatigue: Secondary | ICD-10-CM | POA: Diagnosis not present

## 2017-05-07 DIAGNOSIS — R69 Illness, unspecified: Secondary | ICD-10-CM | POA: Diagnosis not present

## 2017-05-08 DIAGNOSIS — F0390 Unspecified dementia without behavioral disturbance: Secondary | ICD-10-CM

## 2017-05-08 DIAGNOSIS — R05 Cough: Secondary | ICD-10-CM | POA: Diagnosis not present

## 2017-05-08 DIAGNOSIS — J9690 Respiratory failure, unspecified, unspecified whether with hypoxia or hypercapnia: Secondary | ICD-10-CM | POA: Diagnosis not present

## 2017-05-08 DIAGNOSIS — I10 Essential (primary) hypertension: Secondary | ICD-10-CM | POA: Diagnosis not present

## 2017-05-08 DIAGNOSIS — Z7952 Long term (current) use of systemic steroids: Secondary | ICD-10-CM | POA: Diagnosis not present

## 2017-05-08 DIAGNOSIS — I129 Hypertensive chronic kidney disease with stage 1 through stage 4 chronic kidney disease, or unspecified chronic kidney disease: Secondary | ICD-10-CM | POA: Diagnosis not present

## 2017-05-08 DIAGNOSIS — R0602 Shortness of breath: Secondary | ICD-10-CM | POA: Diagnosis not present

## 2017-05-08 DIAGNOSIS — Z7982 Long term (current) use of aspirin: Secondary | ICD-10-CM | POA: Diagnosis not present

## 2017-05-08 DIAGNOSIS — J9601 Acute respiratory failure with hypoxia: Secondary | ICD-10-CM | POA: Diagnosis not present

## 2017-05-08 DIAGNOSIS — K521 Toxic gastroenteritis and colitis: Secondary | ICD-10-CM | POA: Diagnosis not present

## 2017-05-08 DIAGNOSIS — K625 Hemorrhage of anus and rectum: Secondary | ICD-10-CM | POA: Diagnosis not present

## 2017-05-08 DIAGNOSIS — N182 Chronic kidney disease, stage 2 (mild): Secondary | ICD-10-CM | POA: Diagnosis not present

## 2017-05-08 DIAGNOSIS — J209 Acute bronchitis, unspecified: Secondary | ICD-10-CM | POA: Diagnosis not present

## 2017-05-08 DIAGNOSIS — E785 Hyperlipidemia, unspecified: Secondary | ICD-10-CM | POA: Diagnosis not present

## 2017-05-08 DIAGNOSIS — R69 Illness, unspecified: Secondary | ICD-10-CM | POA: Diagnosis not present

## 2017-05-08 DIAGNOSIS — E871 Hypo-osmolality and hyponatremia: Secondary | ICD-10-CM | POA: Diagnosis not present

## 2017-05-08 DIAGNOSIS — I4891 Unspecified atrial fibrillation: Secondary | ICD-10-CM | POA: Diagnosis not present

## 2017-05-12 DIAGNOSIS — I4891 Unspecified atrial fibrillation: Secondary | ICD-10-CM

## 2017-05-15 DIAGNOSIS — R69 Illness, unspecified: Secondary | ICD-10-CM | POA: Diagnosis not present

## 2017-05-15 DIAGNOSIS — I10 Essential (primary) hypertension: Secondary | ICD-10-CM | POA: Diagnosis not present

## 2017-05-15 DIAGNOSIS — R319 Hematuria, unspecified: Secondary | ICD-10-CM | POA: Diagnosis not present

## 2017-05-18 DIAGNOSIS — E7849 Other hyperlipidemia: Secondary | ICD-10-CM | POA: Diagnosis not present

## 2017-05-18 DIAGNOSIS — E038 Other specified hypothyroidism: Secondary | ICD-10-CM | POA: Diagnosis not present

## 2017-05-18 DIAGNOSIS — Z79899 Other long term (current) drug therapy: Secondary | ICD-10-CM | POA: Diagnosis not present

## 2017-05-18 DIAGNOSIS — E559 Vitamin D deficiency, unspecified: Secondary | ICD-10-CM | POA: Diagnosis not present

## 2017-05-18 DIAGNOSIS — D518 Other vitamin B12 deficiency anemias: Secondary | ICD-10-CM | POA: Diagnosis not present

## 2017-05-18 DIAGNOSIS — E119 Type 2 diabetes mellitus without complications: Secondary | ICD-10-CM | POA: Diagnosis not present

## 2017-05-24 DIAGNOSIS — E119 Type 2 diabetes mellitus without complications: Secondary | ICD-10-CM | POA: Diagnosis not present

## 2017-05-24 DIAGNOSIS — E7849 Other hyperlipidemia: Secondary | ICD-10-CM | POA: Diagnosis not present

## 2017-05-24 DIAGNOSIS — D518 Other vitamin B12 deficiency anemias: Secondary | ICD-10-CM | POA: Diagnosis not present

## 2017-05-24 DIAGNOSIS — Z79899 Other long term (current) drug therapy: Secondary | ICD-10-CM | POA: Diagnosis not present

## 2017-06-05 DIAGNOSIS — Z0000 Encounter for general adult medical examination without abnormal findings: Secondary | ICD-10-CM | POA: Diagnosis not present

## 2017-06-06 DIAGNOSIS — J9601 Acute respiratory failure with hypoxia: Secondary | ICD-10-CM | POA: Diagnosis not present

## 2017-06-06 DIAGNOSIS — I10 Essential (primary) hypertension: Secondary | ICD-10-CM | POA: Diagnosis not present

## 2017-06-06 DIAGNOSIS — N182 Chronic kidney disease, stage 2 (mild): Secondary | ICD-10-CM | POA: Diagnosis not present

## 2017-06-06 DIAGNOSIS — M159 Polyosteoarthritis, unspecified: Secondary | ICD-10-CM | POA: Diagnosis not present

## 2017-06-07 DIAGNOSIS — I509 Heart failure, unspecified: Secondary | ICD-10-CM | POA: Diagnosis not present

## 2017-06-07 DIAGNOSIS — N182 Chronic kidney disease, stage 2 (mild): Secondary | ICD-10-CM | POA: Diagnosis not present

## 2017-06-07 DIAGNOSIS — E611 Iron deficiency: Secondary | ICD-10-CM | POA: Diagnosis not present

## 2017-06-07 DIAGNOSIS — Z853 Personal history of malignant neoplasm of breast: Secondary | ICD-10-CM | POA: Diagnosis not present

## 2017-06-07 DIAGNOSIS — E871 Hypo-osmolality and hyponatremia: Secondary | ICD-10-CM | POA: Diagnosis not present

## 2017-06-07 DIAGNOSIS — I129 Hypertensive chronic kidney disease with stage 1 through stage 4 chronic kidney disease, or unspecified chronic kidney disease: Secondary | ICD-10-CM | POA: Diagnosis not present

## 2017-06-07 DIAGNOSIS — E559 Vitamin D deficiency, unspecified: Secondary | ICD-10-CM | POA: Diagnosis not present

## 2017-06-07 DIAGNOSIS — Z1231 Encounter for screening mammogram for malignant neoplasm of breast: Secondary | ICD-10-CM | POA: Diagnosis not present

## 2017-06-08 DIAGNOSIS — Z79899 Other long term (current) drug therapy: Secondary | ICD-10-CM | POA: Diagnosis not present

## 2017-06-21 DIAGNOSIS — R69 Illness, unspecified: Secondary | ICD-10-CM | POA: Diagnosis not present

## 2017-06-26 DIAGNOSIS — R011 Cardiac murmur, unspecified: Secondary | ICD-10-CM | POA: Diagnosis not present

## 2017-07-04 DIAGNOSIS — R42 Dizziness and giddiness: Secondary | ICD-10-CM | POA: Diagnosis not present

## 2017-07-04 DIAGNOSIS — D518 Other vitamin B12 deficiency anemias: Secondary | ICD-10-CM | POA: Diagnosis not present

## 2017-07-04 DIAGNOSIS — E569 Vitamin deficiency, unspecified: Secondary | ICD-10-CM | POA: Diagnosis not present

## 2017-07-04 DIAGNOSIS — E611 Iron deficiency: Secondary | ICD-10-CM | POA: Diagnosis not present

## 2017-07-04 DIAGNOSIS — I5032 Chronic diastolic (congestive) heart failure: Secondary | ICD-10-CM | POA: Diagnosis not present

## 2017-07-04 DIAGNOSIS — H8113 Benign paroxysmal vertigo, bilateral: Secondary | ICD-10-CM | POA: Diagnosis not present

## 2017-07-04 DIAGNOSIS — I10 Essential (primary) hypertension: Secondary | ICD-10-CM | POA: Diagnosis not present

## 2017-07-04 DIAGNOSIS — E559 Vitamin D deficiency, unspecified: Secondary | ICD-10-CM | POA: Diagnosis not present

## 2017-07-04 DIAGNOSIS — Z0001 Encounter for general adult medical examination with abnormal findings: Secondary | ICD-10-CM | POA: Diagnosis not present

## 2017-07-10 DIAGNOSIS — I739 Peripheral vascular disease, unspecified: Secondary | ICD-10-CM | POA: Diagnosis not present

## 2017-07-10 DIAGNOSIS — E785 Hyperlipidemia, unspecified: Secondary | ICD-10-CM | POA: Diagnosis not present

## 2017-07-10 DIAGNOSIS — R350 Frequency of micturition: Secondary | ICD-10-CM | POA: Diagnosis not present

## 2017-07-10 DIAGNOSIS — D508 Other iron deficiency anemias: Secondary | ICD-10-CM | POA: Diagnosis not present

## 2017-07-10 DIAGNOSIS — I5032 Chronic diastolic (congestive) heart failure: Secondary | ICD-10-CM | POA: Diagnosis not present

## 2017-07-10 DIAGNOSIS — R69 Illness, unspecified: Secondary | ICD-10-CM | POA: Diagnosis not present

## 2017-07-10 DIAGNOSIS — D631 Anemia in chronic kidney disease: Secondary | ICD-10-CM | POA: Diagnosis not present

## 2017-07-10 DIAGNOSIS — H538 Other visual disturbances: Secondary | ICD-10-CM | POA: Diagnosis not present

## 2017-07-10 DIAGNOSIS — N182 Chronic kidney disease, stage 2 (mild): Secondary | ICD-10-CM | POA: Diagnosis not present

## 2017-07-10 DIAGNOSIS — N3281 Overactive bladder: Secondary | ICD-10-CM | POA: Diagnosis not present

## 2017-07-10 DIAGNOSIS — R42 Dizziness and giddiness: Secondary | ICD-10-CM | POA: Diagnosis not present

## 2017-07-10 DIAGNOSIS — G4700 Insomnia, unspecified: Secondary | ICD-10-CM | POA: Diagnosis not present

## 2017-07-10 DIAGNOSIS — Z853 Personal history of malignant neoplasm of breast: Secondary | ICD-10-CM | POA: Diagnosis not present

## 2017-07-10 DIAGNOSIS — I130 Hypertensive heart and chronic kidney disease with heart failure and stage 1 through stage 4 chronic kidney disease, or unspecified chronic kidney disease: Secondary | ICD-10-CM | POA: Diagnosis not present

## 2017-07-11 DIAGNOSIS — R42 Dizziness and giddiness: Secondary | ICD-10-CM | POA: Diagnosis not present

## 2017-07-11 DIAGNOSIS — R279 Unspecified lack of coordination: Secondary | ICD-10-CM | POA: Diagnosis not present

## 2017-07-11 DIAGNOSIS — M6281 Muscle weakness (generalized): Secondary | ICD-10-CM | POA: Diagnosis not present

## 2017-07-12 DIAGNOSIS — G4700 Insomnia, unspecified: Secondary | ICD-10-CM | POA: Diagnosis not present

## 2017-07-12 DIAGNOSIS — R279 Unspecified lack of coordination: Secondary | ICD-10-CM | POA: Diagnosis not present

## 2017-07-12 DIAGNOSIS — R42 Dizziness and giddiness: Secondary | ICD-10-CM | POA: Diagnosis not present

## 2017-07-12 DIAGNOSIS — E785 Hyperlipidemia, unspecified: Secondary | ICD-10-CM | POA: Diagnosis not present

## 2017-07-12 DIAGNOSIS — N3281 Overactive bladder: Secondary | ICD-10-CM | POA: Diagnosis not present

## 2017-07-12 DIAGNOSIS — I739 Peripheral vascular disease, unspecified: Secondary | ICD-10-CM | POA: Diagnosis not present

## 2017-07-12 DIAGNOSIS — I130 Hypertensive heart and chronic kidney disease with heart failure and stage 1 through stage 4 chronic kidney disease, or unspecified chronic kidney disease: Secondary | ICD-10-CM | POA: Diagnosis not present

## 2017-07-12 DIAGNOSIS — I5032 Chronic diastolic (congestive) heart failure: Secondary | ICD-10-CM | POA: Diagnosis not present

## 2017-07-12 DIAGNOSIS — M6281 Muscle weakness (generalized): Secondary | ICD-10-CM | POA: Diagnosis not present

## 2017-07-12 DIAGNOSIS — R69 Illness, unspecified: Secondary | ICD-10-CM | POA: Diagnosis not present

## 2017-07-12 DIAGNOSIS — N182 Chronic kidney disease, stage 2 (mild): Secondary | ICD-10-CM | POA: Diagnosis not present

## 2017-07-12 DIAGNOSIS — D631 Anemia in chronic kidney disease: Secondary | ICD-10-CM | POA: Diagnosis not present

## 2017-07-12 DIAGNOSIS — Z853 Personal history of malignant neoplasm of breast: Secondary | ICD-10-CM | POA: Diagnosis not present

## 2017-07-14 DIAGNOSIS — M6281 Muscle weakness (generalized): Secondary | ICD-10-CM | POA: Diagnosis not present

## 2017-07-14 DIAGNOSIS — R279 Unspecified lack of coordination: Secondary | ICD-10-CM | POA: Diagnosis not present

## 2017-07-14 DIAGNOSIS — R42 Dizziness and giddiness: Secondary | ICD-10-CM | POA: Diagnosis not present

## 2017-07-18 DIAGNOSIS — R42 Dizziness and giddiness: Secondary | ICD-10-CM | POA: Diagnosis not present

## 2017-07-18 DIAGNOSIS — M6281 Muscle weakness (generalized): Secondary | ICD-10-CM | POA: Diagnosis not present

## 2017-07-18 DIAGNOSIS — R279 Unspecified lack of coordination: Secondary | ICD-10-CM | POA: Diagnosis not present

## 2017-07-19 DIAGNOSIS — R279 Unspecified lack of coordination: Secondary | ICD-10-CM | POA: Diagnosis not present

## 2017-07-19 DIAGNOSIS — M6281 Muscle weakness (generalized): Secondary | ICD-10-CM | POA: Diagnosis not present

## 2017-07-19 DIAGNOSIS — R42 Dizziness and giddiness: Secondary | ICD-10-CM | POA: Diagnosis not present

## 2017-07-23 DIAGNOSIS — R42 Dizziness and giddiness: Secondary | ICD-10-CM | POA: Diagnosis not present

## 2017-07-23 DIAGNOSIS — R279 Unspecified lack of coordination: Secondary | ICD-10-CM | POA: Diagnosis not present

## 2017-07-23 DIAGNOSIS — M6281 Muscle weakness (generalized): Secondary | ICD-10-CM | POA: Diagnosis not present

## 2018-06-11 DIAGNOSIS — N182 Chronic kidney disease, stage 2 (mild): Secondary | ICD-10-CM

## 2018-06-11 DIAGNOSIS — I10 Essential (primary) hypertension: Secondary | ICD-10-CM

## 2018-06-11 DIAGNOSIS — E785 Hyperlipidemia, unspecified: Secondary | ICD-10-CM | POA: Diagnosis not present

## 2018-06-11 DIAGNOSIS — J189 Pneumonia, unspecified organism: Secondary | ICD-10-CM

## 2018-06-11 DIAGNOSIS — R262 Difficulty in walking, not elsewhere classified: Secondary | ICD-10-CM

## 2018-06-11 DIAGNOSIS — J441 Chronic obstructive pulmonary disease with (acute) exacerbation: Secondary | ICD-10-CM

## 2018-06-11 DIAGNOSIS — J9600 Acute respiratory failure, unspecified whether with hypoxia or hypercapnia: Secondary | ICD-10-CM | POA: Diagnosis not present

## 2018-06-11 DIAGNOSIS — A419 Sepsis, unspecified organism: Secondary | ICD-10-CM

## 2018-06-11 DIAGNOSIS — F0390 Unspecified dementia without behavioral disturbance: Secondary | ICD-10-CM

## 2018-06-11 DIAGNOSIS — J9601 Acute respiratory failure with hypoxia: Secondary | ICD-10-CM

## 2018-06-12 DIAGNOSIS — J9600 Acute respiratory failure, unspecified whether with hypoxia or hypercapnia: Secondary | ICD-10-CM | POA: Diagnosis not present

## 2018-06-12 DIAGNOSIS — I10 Essential (primary) hypertension: Secondary | ICD-10-CM | POA: Diagnosis not present

## 2018-06-12 DIAGNOSIS — E785 Hyperlipidemia, unspecified: Secondary | ICD-10-CM | POA: Diagnosis not present

## 2018-06-12 DIAGNOSIS — J9601 Acute respiratory failure with hypoxia: Secondary | ICD-10-CM | POA: Diagnosis not present

## 2018-06-13 DIAGNOSIS — J9601 Acute respiratory failure with hypoxia: Secondary | ICD-10-CM | POA: Diagnosis not present

## 2018-06-13 DIAGNOSIS — J9600 Acute respiratory failure, unspecified whether with hypoxia or hypercapnia: Secondary | ICD-10-CM | POA: Diagnosis not present

## 2018-06-13 DIAGNOSIS — E785 Hyperlipidemia, unspecified: Secondary | ICD-10-CM | POA: Diagnosis not present

## 2018-06-13 DIAGNOSIS — I10 Essential (primary) hypertension: Secondary | ICD-10-CM | POA: Diagnosis not present

## 2018-06-14 DIAGNOSIS — I10 Essential (primary) hypertension: Secondary | ICD-10-CM | POA: Diagnosis not present

## 2018-06-14 DIAGNOSIS — J9600 Acute respiratory failure, unspecified whether with hypoxia or hypercapnia: Secondary | ICD-10-CM | POA: Diagnosis not present

## 2018-06-14 DIAGNOSIS — E785 Hyperlipidemia, unspecified: Secondary | ICD-10-CM | POA: Diagnosis not present

## 2018-06-14 DIAGNOSIS — J9601 Acute respiratory failure with hypoxia: Secondary | ICD-10-CM | POA: Diagnosis not present

## 2018-06-15 DIAGNOSIS — E785 Hyperlipidemia, unspecified: Secondary | ICD-10-CM | POA: Diagnosis not present

## 2018-06-15 DIAGNOSIS — J9600 Acute respiratory failure, unspecified whether with hypoxia or hypercapnia: Secondary | ICD-10-CM | POA: Diagnosis not present

## 2018-06-15 DIAGNOSIS — J9601 Acute respiratory failure with hypoxia: Secondary | ICD-10-CM | POA: Diagnosis not present

## 2018-06-15 DIAGNOSIS — I10 Essential (primary) hypertension: Secondary | ICD-10-CM | POA: Diagnosis not present

## 2018-06-16 DIAGNOSIS — I10 Essential (primary) hypertension: Secondary | ICD-10-CM | POA: Diagnosis not present

## 2018-06-16 DIAGNOSIS — J9600 Acute respiratory failure, unspecified whether with hypoxia or hypercapnia: Secondary | ICD-10-CM | POA: Diagnosis not present

## 2018-06-16 DIAGNOSIS — E785 Hyperlipidemia, unspecified: Secondary | ICD-10-CM | POA: Diagnosis not present

## 2018-06-16 DIAGNOSIS — J9601 Acute respiratory failure with hypoxia: Secondary | ICD-10-CM | POA: Diagnosis not present

## 2018-06-17 DIAGNOSIS — I10 Essential (primary) hypertension: Secondary | ICD-10-CM | POA: Diagnosis not present

## 2018-06-17 DIAGNOSIS — E785 Hyperlipidemia, unspecified: Secondary | ICD-10-CM | POA: Diagnosis not present

## 2018-06-17 DIAGNOSIS — J9600 Acute respiratory failure, unspecified whether with hypoxia or hypercapnia: Secondary | ICD-10-CM | POA: Diagnosis not present

## 2018-06-17 DIAGNOSIS — J9601 Acute respiratory failure with hypoxia: Secondary | ICD-10-CM | POA: Diagnosis not present

## 2018-06-18 DIAGNOSIS — I10 Essential (primary) hypertension: Secondary | ICD-10-CM | POA: Diagnosis not present

## 2018-06-18 DIAGNOSIS — J9601 Acute respiratory failure with hypoxia: Secondary | ICD-10-CM | POA: Diagnosis not present

## 2018-06-18 DIAGNOSIS — E785 Hyperlipidemia, unspecified: Secondary | ICD-10-CM | POA: Diagnosis not present

## 2018-06-18 DIAGNOSIS — J9600 Acute respiratory failure, unspecified whether with hypoxia or hypercapnia: Secondary | ICD-10-CM | POA: Diagnosis not present

## 2018-06-19 DIAGNOSIS — I10 Essential (primary) hypertension: Secondary | ICD-10-CM | POA: Diagnosis not present

## 2018-06-19 DIAGNOSIS — J9600 Acute respiratory failure, unspecified whether with hypoxia or hypercapnia: Secondary | ICD-10-CM | POA: Diagnosis not present

## 2018-06-19 DIAGNOSIS — E785 Hyperlipidemia, unspecified: Secondary | ICD-10-CM | POA: Diagnosis not present

## 2018-06-19 DIAGNOSIS — J9601 Acute respiratory failure with hypoxia: Secondary | ICD-10-CM | POA: Diagnosis not present

## 2018-06-20 DIAGNOSIS — J9600 Acute respiratory failure, unspecified whether with hypoxia or hypercapnia: Secondary | ICD-10-CM | POA: Diagnosis not present

## 2018-06-20 DIAGNOSIS — E785 Hyperlipidemia, unspecified: Secondary | ICD-10-CM | POA: Diagnosis not present

## 2018-06-20 DIAGNOSIS — J9601 Acute respiratory failure with hypoxia: Secondary | ICD-10-CM | POA: Diagnosis not present

## 2018-06-20 DIAGNOSIS — I10 Essential (primary) hypertension: Secondary | ICD-10-CM | POA: Diagnosis not present

## 2018-06-21 DIAGNOSIS — E785 Hyperlipidemia, unspecified: Secondary | ICD-10-CM | POA: Diagnosis not present

## 2018-06-21 DIAGNOSIS — J9600 Acute respiratory failure, unspecified whether with hypoxia or hypercapnia: Secondary | ICD-10-CM | POA: Diagnosis not present

## 2018-06-21 DIAGNOSIS — I10 Essential (primary) hypertension: Secondary | ICD-10-CM | POA: Diagnosis not present

## 2018-06-21 DIAGNOSIS — J9601 Acute respiratory failure with hypoxia: Secondary | ICD-10-CM | POA: Diagnosis not present

## 2018-06-22 DIAGNOSIS — J9601 Acute respiratory failure with hypoxia: Secondary | ICD-10-CM | POA: Diagnosis not present

## 2018-06-22 DIAGNOSIS — E785 Hyperlipidemia, unspecified: Secondary | ICD-10-CM | POA: Diagnosis not present

## 2018-06-22 DIAGNOSIS — I10 Essential (primary) hypertension: Secondary | ICD-10-CM | POA: Diagnosis not present

## 2018-06-22 DIAGNOSIS — J9600 Acute respiratory failure, unspecified whether with hypoxia or hypercapnia: Secondary | ICD-10-CM | POA: Diagnosis not present

## 2018-06-23 DIAGNOSIS — J9600 Acute respiratory failure, unspecified whether with hypoxia or hypercapnia: Secondary | ICD-10-CM | POA: Diagnosis not present

## 2018-06-23 DIAGNOSIS — J9601 Acute respiratory failure with hypoxia: Secondary | ICD-10-CM | POA: Diagnosis not present

## 2018-06-23 DIAGNOSIS — I10 Essential (primary) hypertension: Secondary | ICD-10-CM | POA: Diagnosis not present

## 2018-06-23 DIAGNOSIS — E785 Hyperlipidemia, unspecified: Secondary | ICD-10-CM | POA: Diagnosis not present

## 2018-06-24 DIAGNOSIS — J9600 Acute respiratory failure, unspecified whether with hypoxia or hypercapnia: Secondary | ICD-10-CM | POA: Diagnosis not present

## 2018-06-24 DIAGNOSIS — I10 Essential (primary) hypertension: Secondary | ICD-10-CM | POA: Diagnosis not present

## 2018-06-24 DIAGNOSIS — E785 Hyperlipidemia, unspecified: Secondary | ICD-10-CM | POA: Diagnosis not present

## 2018-06-24 DIAGNOSIS — J9601 Acute respiratory failure with hypoxia: Secondary | ICD-10-CM | POA: Diagnosis not present

## 2018-06-27 DIAGNOSIS — I10 Essential (primary) hypertension: Secondary | ICD-10-CM

## 2018-06-27 DIAGNOSIS — F419 Anxiety disorder, unspecified: Secondary | ICD-10-CM

## 2018-06-27 DIAGNOSIS — R6510 Systemic inflammatory response syndrome (SIRS) of non-infectious origin without acute organ dysfunction: Secondary | ICD-10-CM

## 2018-06-27 DIAGNOSIS — E785 Hyperlipidemia, unspecified: Secondary | ICD-10-CM

## 2018-06-27 DIAGNOSIS — M838 Other adult osteomalacia: Secondary | ICD-10-CM

## 2018-06-27 DIAGNOSIS — Z66 Do not resuscitate: Secondary | ICD-10-CM

## 2018-06-27 DIAGNOSIS — R0603 Acute respiratory distress: Secondary | ICD-10-CM

## 2018-06-27 DIAGNOSIS — J441 Chronic obstructive pulmonary disease with (acute) exacerbation: Secondary | ICD-10-CM

## 2018-06-27 DIAGNOSIS — M419 Scoliosis, unspecified: Secondary | ICD-10-CM

## 2018-06-27 DIAGNOSIS — J9621 Acute and chronic respiratory failure with hypoxia: Secondary | ICD-10-CM

## 2018-06-28 DIAGNOSIS — R6510 Systemic inflammatory response syndrome (SIRS) of non-infectious origin without acute organ dysfunction: Secondary | ICD-10-CM | POA: Diagnosis not present

## 2018-06-28 DIAGNOSIS — J9621 Acute and chronic respiratory failure with hypoxia: Secondary | ICD-10-CM | POA: Diagnosis not present

## 2018-06-28 DIAGNOSIS — R0603 Acute respiratory distress: Secondary | ICD-10-CM | POA: Diagnosis not present

## 2018-06-28 DIAGNOSIS — J441 Chronic obstructive pulmonary disease with (acute) exacerbation: Secondary | ICD-10-CM | POA: Diagnosis not present

## 2018-06-29 DIAGNOSIS — R6510 Systemic inflammatory response syndrome (SIRS) of non-infectious origin without acute organ dysfunction: Secondary | ICD-10-CM | POA: Diagnosis not present

## 2018-06-29 DIAGNOSIS — J9621 Acute and chronic respiratory failure with hypoxia: Secondary | ICD-10-CM | POA: Diagnosis not present

## 2018-06-29 DIAGNOSIS — J441 Chronic obstructive pulmonary disease with (acute) exacerbation: Secondary | ICD-10-CM | POA: Diagnosis not present

## 2018-06-29 DIAGNOSIS — R0603 Acute respiratory distress: Secondary | ICD-10-CM | POA: Diagnosis not present

## 2018-06-30 DIAGNOSIS — R0603 Acute respiratory distress: Secondary | ICD-10-CM | POA: Diagnosis not present

## 2018-06-30 DIAGNOSIS — J441 Chronic obstructive pulmonary disease with (acute) exacerbation: Secondary | ICD-10-CM | POA: Diagnosis not present

## 2018-06-30 DIAGNOSIS — J9621 Acute and chronic respiratory failure with hypoxia: Secondary | ICD-10-CM | POA: Diagnosis not present

## 2018-06-30 DIAGNOSIS — R6510 Systemic inflammatory response syndrome (SIRS) of non-infectious origin without acute organ dysfunction: Secondary | ICD-10-CM | POA: Diagnosis not present

## 2018-07-01 DIAGNOSIS — J9621 Acute and chronic respiratory failure with hypoxia: Secondary | ICD-10-CM | POA: Diagnosis not present

## 2018-07-01 DIAGNOSIS — R6510 Systemic inflammatory response syndrome (SIRS) of non-infectious origin without acute organ dysfunction: Secondary | ICD-10-CM | POA: Diagnosis not present

## 2018-07-01 DIAGNOSIS — R0603 Acute respiratory distress: Secondary | ICD-10-CM | POA: Diagnosis not present

## 2018-07-01 DIAGNOSIS — J441 Chronic obstructive pulmonary disease with (acute) exacerbation: Secondary | ICD-10-CM | POA: Diagnosis not present

## 2018-07-02 DIAGNOSIS — R0603 Acute respiratory distress: Secondary | ICD-10-CM | POA: Diagnosis not present

## 2018-07-02 DIAGNOSIS — J9621 Acute and chronic respiratory failure with hypoxia: Secondary | ICD-10-CM | POA: Diagnosis not present

## 2018-07-02 DIAGNOSIS — J441 Chronic obstructive pulmonary disease with (acute) exacerbation: Secondary | ICD-10-CM | POA: Diagnosis not present

## 2018-07-02 DIAGNOSIS — R6510 Systemic inflammatory response syndrome (SIRS) of non-infectious origin without acute organ dysfunction: Secondary | ICD-10-CM | POA: Diagnosis not present

## 2018-07-03 DIAGNOSIS — J441 Chronic obstructive pulmonary disease with (acute) exacerbation: Secondary | ICD-10-CM | POA: Diagnosis not present

## 2018-07-03 DIAGNOSIS — R6510 Systemic inflammatory response syndrome (SIRS) of non-infectious origin without acute organ dysfunction: Secondary | ICD-10-CM | POA: Diagnosis not present

## 2018-07-03 DIAGNOSIS — J9621 Acute and chronic respiratory failure with hypoxia: Secondary | ICD-10-CM | POA: Diagnosis not present

## 2018-07-03 DIAGNOSIS — R0603 Acute respiratory distress: Secondary | ICD-10-CM | POA: Diagnosis not present

## 2018-07-04 DIAGNOSIS — J441 Chronic obstructive pulmonary disease with (acute) exacerbation: Secondary | ICD-10-CM | POA: Diagnosis not present

## 2018-07-04 DIAGNOSIS — R6510 Systemic inflammatory response syndrome (SIRS) of non-infectious origin without acute organ dysfunction: Secondary | ICD-10-CM | POA: Diagnosis not present

## 2018-07-04 DIAGNOSIS — R0603 Acute respiratory distress: Secondary | ICD-10-CM | POA: Diagnosis not present

## 2018-07-04 DIAGNOSIS — J9621 Acute and chronic respiratory failure with hypoxia: Secondary | ICD-10-CM | POA: Diagnosis not present

## 2018-07-05 DIAGNOSIS — R0603 Acute respiratory distress: Secondary | ICD-10-CM | POA: Diagnosis not present

## 2018-07-05 DIAGNOSIS — J441 Chronic obstructive pulmonary disease with (acute) exacerbation: Secondary | ICD-10-CM | POA: Diagnosis not present

## 2018-07-05 DIAGNOSIS — R6510 Systemic inflammatory response syndrome (SIRS) of non-infectious origin without acute organ dysfunction: Secondary | ICD-10-CM | POA: Diagnosis not present

## 2018-07-05 DIAGNOSIS — J9621 Acute and chronic respiratory failure with hypoxia: Secondary | ICD-10-CM | POA: Diagnosis not present

## 2018-07-24 DEATH — deceased
